# Patient Record
Sex: Male | Born: 1980 | Race: White | Hispanic: No | Marital: Married | Smoking: Former smoker
Health system: Southern US, Community
[De-identification: ages and names within clinical notes are randomized; demographics above are authoritative.]

## PROBLEM LIST (undated history)

## (undated) DIAGNOSIS — G4730 Sleep apnea, unspecified: Secondary | ICD-10-CM

## (undated) DIAGNOSIS — T7840XA Allergy, unspecified, initial encounter: Secondary | ICD-10-CM

## (undated) DIAGNOSIS — R002 Palpitations: Secondary | ICD-10-CM

## (undated) DIAGNOSIS — K769 Liver disease, unspecified: Secondary | ICD-10-CM

## (undated) HISTORY — DX: Liver disease, unspecified: K76.9

## (undated) HISTORY — DX: Allergy, unspecified, initial encounter: T78.40XA

## (undated) HISTORY — DX: Sleep apnea, unspecified: G47.30

## (undated) HISTORY — DX: Palpitations: R00.2

## (undated) HISTORY — PX: TYMPANOSTOMY TUBE PLACEMENT: SHX32

## (undated) HISTORY — PX: TONSILLECTOMY: SUR1361

---

## 2013-08-24 ENCOUNTER — Ambulatory Visit (INDEPENDENT_AMBULATORY_CARE_PROVIDER_SITE_OTHER): Payer: BC Managed Care – PPO | Admitting: Podiatry

## 2013-08-24 ENCOUNTER — Encounter: Payer: Self-pay | Admitting: Podiatry

## 2013-08-24 ENCOUNTER — Ambulatory Visit (INDEPENDENT_AMBULATORY_CARE_PROVIDER_SITE_OTHER): Payer: BC Managed Care – PPO

## 2013-08-24 VITALS — BP 115/78 | HR 66 | Resp 16 | Ht 74.0 in | Wt 210.0 lb

## 2013-08-24 DIAGNOSIS — M722 Plantar fascial fibromatosis: Secondary | ICD-10-CM

## 2013-08-24 MED ORDER — TRIAMCINOLONE ACETONIDE 10 MG/ML IJ SUSP
10.0000 mg | Freq: Once | INTRAMUSCULAR | Status: AC
  Administered 2013-08-24: 10 mg

## 2013-08-24 MED ORDER — DICLOFENAC SODIUM 75 MG PO TBEC: 75.0000 mg | DELAYED_RELEASE_TABLET | Freq: Two times a day (BID) | ORAL | Status: DC

## 2013-08-24 NOTE — Progress Notes (Signed)
Subjective:     Patient ID: Richard Vega, male   DOB: 31-Mar-1980, 33 y.o.   MRN: [id number]  Foot Pain   patient presents stating both of my heels are hurting but the left is worse and it's been going on for 6 months. Taking a lot of ibuprofen but that's not been helping   Review of Systems  All other systems reviewed and are negative.      Objective:   Physical Exam  Vitals reviewed. Constitutional: He is oriented to person, place, and time.  Cardiovascular: Intact distal pulses.   Musculoskeletal: Normal range of motion.  Neurological: He is oriented to person, place, and time.  Skin: Skin is warm.   neurovascular status intact muscle strength is found to be within normal limits and range of motion of the subtalar midtarsal joint is within normal limits. Depression of the arch is noted and I noted there to be discomfort in the plantar fascial left over right at the insertion into the calcaneus with good digital perfusion noted. Mild equinus condition was noted bilateral and diminishment of the arch height upon weightbearing     Assessment:     Plan her fasciitis of an intense nature left over right with inflammation and fluid accumulation    Plan:     H&P and x-rays reviewed and today I injected the plantar fascial left 3 mg Kenalog 5 mg like Marcaine mixture and applied fascially brace with instructions. Placed on diclofenac 75 mg twice a day and reappoint to recheck again in 1 week and discussed long-term orthotics

## 2013-08-24 NOTE — Progress Notes (Signed)
   Subjective:    Patient ID: Richard Vega, male    DOB: 10/19/1980, 33 y.o.   MRN: [id number]  HPI Comments: i have heel and arch pain in both feet. Its been going on for 6 months. The left one is worse. It hurts to walk and stand. The pain has remained the same. i wear otc inserts. i have been taking ibuprofen.   Foot Pain      Review of Systems  HENT:       Sinus problems  Eyes: Positive for redness.  Musculoskeletal:       Difficulty walking  All other systems reviewed and are negative.      Objective:   Physical Exam        Assessment & Plan:

## 2013-08-24 NOTE — Patient Instructions (Signed)

## 2013-08-31 ENCOUNTER — Ambulatory Visit: Payer: BC Managed Care – PPO | Admitting: Podiatry

## 2014-02-15 ENCOUNTER — Ambulatory Visit: Payer: Self-pay | Admitting: Physician Assistant

## 2015-09-08 ENCOUNTER — Encounter: Payer: Self-pay | Admitting: Internal Medicine

## 2015-12-07 ENCOUNTER — Ambulatory Visit (INDEPENDENT_AMBULATORY_CARE_PROVIDER_SITE_OTHER): Payer: Managed Care, Other (non HMO) | Admitting: Internal Medicine

## 2015-12-07 ENCOUNTER — Encounter (INDEPENDENT_AMBULATORY_CARE_PROVIDER_SITE_OTHER): Payer: Self-pay

## 2015-12-07 ENCOUNTER — Encounter: Payer: Self-pay | Admitting: Internal Medicine

## 2015-12-07 VITALS — BP 112/84 | HR 58 | Ht 73.0 in | Wt 211.5 lb

## 2015-12-07 DIAGNOSIS — R9431 Abnormal electrocardiogram [ECG] [EKG]: Secondary | ICD-10-CM

## 2015-12-07 DIAGNOSIS — R002 Palpitations: Secondary | ICD-10-CM

## 2015-12-07 DIAGNOSIS — G4730 Sleep apnea, unspecified: Secondary | ICD-10-CM | POA: Diagnosis not present

## 2015-12-07 HISTORY — DX: Sleep apnea, unspecified: G47.30

## 2015-12-07 HISTORY — DX: Palpitations: R00.2

## 2015-12-07 NOTE — Patient Instructions (Addendum)
Medication Instructions: - Your physician recommends that you continue on your current medications as directed. Please refer to the Current Medication list given to you today.  Labwork: - none ordered  Procedures/Testing: - none ordered  Follow-Up: - Your physician recommends that you schedule a follow-up appointment in: 10-12 weeks with Dr. Graciela HusbandsKlein.  Any Additional Special Instructions Will Be Listed Below (If Applicable). - AliveCor monitor for your phone   If you need a refill on your cardiac medications before your next appointment, please call your pharmacy.

## 2015-12-07 NOTE — Progress Notes (Signed)
ELECTROPHYSIOLOGY CONSULT NOTE  Patient ID: Richard Vega, MRN: [id number], DOB/AGE: Nov 25, 1980 35 y.o. Admit date: (Not on file) Date of Consult: 12/07/2015  Primary Physician: Lyndon CodeKHAN, FOZIA M, MD Primary Cardiologist: new Consulting Physician [phone]  Chief Complaint: abnormal Holter   HPI Richard Vega is a 35 y.o. male referred because of an abnormal Holter. This was obtained because of complaints of palpitations associated with shortness of breath. He also has significant fatigue. His wife says he snores. He also has a new baby. He has blamed his fatigue on his daughter.  He doesn't exercise because of fatigue. He has lost 20 pounds of late simply by dieting. Shortness of breath and palpitations have improved but persists.  Holter monitor 8/17 heart rate mean 77 (54/134) atrial runs-9 longest 59.5 seconds. The onset of these runs is not recorded. These were rapid rates occurred during waking hours PVC count 0 he also was noted to have significant variability in heart rates occurring during sleeping hours.  Echocardiogram 9/17 normal LV function, normal chamber sizes  Laboratory 9/17 reviewed. Hemoglobin 15.8 creatinine 1.21, potassium 4.4, TSH 2.37   Past Medical History:  Diagnosis Date  . Hyperlipidemia       Surgical History:  Past Surgical History:  Procedure Laterality Date  . TONSILLECTOMY       Home Meds: Prior to Admission medications   Medication Sig Start Date End Date Taking? Authorizing Provider  cetirizine (ZYRTEC) 10 MG tablet Take 10 mg by mouth as needed for allergies.    Historical Provider, MD  diclofenac (VOLTAREN) 75 MG EC tablet Take 1 tablet (75 mg total) by mouth 2 (two) times daily. 08/24/13   Lenn SinkNorman S Regal, DPM  sertraline (ZOLOFT) 50 MG tablet Take 50 mg by mouth daily.    Historical Provider, MD    Allergies: No Known Allergies  Social History   Social History  . Marital status: Single    Spouse name: N/A  . Number of children:  N/A  . Years of education: N/A   Occupational History  . Not on file.   Social History Main Topics  . Smoking status: Never Smoker  . Smokeless tobacco: Never Used  . Alcohol use Yes     Comment: occasionally  . Drug use: No  . Sexual activity: Not on file   Other Topics Concern  . Not on file   Social History Narrative  . No narrative on file     History reviewed. No pertinent family history.   ROS:  Please see the history of present illness.     All other systems reviewed and negative.    Physical Exam:  Blood pressure 112/84, pulse (!) 58, height 6\' 1"  (1.854 m), weight 211 lb 8 oz (95.9 kg). General: Well developed, well nourished male in no acute distress. Head: Normocephalic, atraumatic, sclera non-icteric, no xanthomas, nares are without discharge. EENT: normal  Lymph Nodes:  none Neck: Negative for carotid bruits. JVD not elevated. Back:without scoliosis kyphosis Lungs: Clear bilaterally to auscultation without wheezes, rales, or rhonchi. Breathing is unlabored. Heart: RRR with S1 S2. No  murmur . No rubs, or gallops appreciated. Abdomen: Soft, non-tender, non-distended with normoactive bowel sounds. No hepatomegaly. No rebound/guarding. No obvious abdominal masses. Msk:  Strength and tone appear normal for age. Extremities: No clubbing or cyanosis. No edema.  Distal pedal pulses are 2+ and equal bilaterally. Skin: Warm and Dry Neuro: Alert and oriented X 3. CN III-XII intact Grossly normal sensory  and motor function . Psych:  Responds to questions appropriately with a normal affect.      Labs: Cardiac Enzymes  Radiology/Studies:  No results found.  EKG: Sinus rhythm at 58 Intervals 14/09/39   Assessment and Plan:  Palpitations/dyspnea  Sleep disordered breathing  Fatigue    The patient has palpitations with dyspnea. He apparently had been while wearing his Holter monitor. While the patient events are not recorded, the interpretation portion of  the Holter notes that "no ectopic activities at diary  Time." I inferred by this that there were no arrhythmias at the time of his symptoms. Still, these have been relatively infrequently. I suggested that we use AliveCor monitor to try to clarify. There is a high likelihood that we will see nothing significant.  The rapid heart rates are sufficiently dissociated from their onset 3: P wave morphologies are difficult to distinguish as they are low amplitude but appear to have the same general appearance as sinus. This would suggest that these rapid rates are in fact sinus tachycardia.  Most importantly, however, I suspect he has significant sleep apnea. He has a deviated septum. I discussed with one of our sleep physicians as to its relevance. It can contribute anatomically. It does not preclude a sleep study. In addition, there is significant changes in RR intervals during his Holter monitor; these are occurring while he is sleeping. 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        Sherryl MangesSteven Temeca Somma

## 2016-02-22 ENCOUNTER — Ambulatory Visit: Payer: Managed Care, Other (non HMO) | Admitting: Internal Medicine

## 2016-03-17 IMAGING — CR DG CHEST 2V
1 series · 2 of 2 positions shown · non-contrast
Comparison: None.

CLINICAL DATA: Shortness of breath.

EXAM:
CHEST  2 VIEW

[Series 1: kdxr chest pa (or ap) and lat · 0.14mm/px · 2 of 2 slices shown]
[im 1/2]
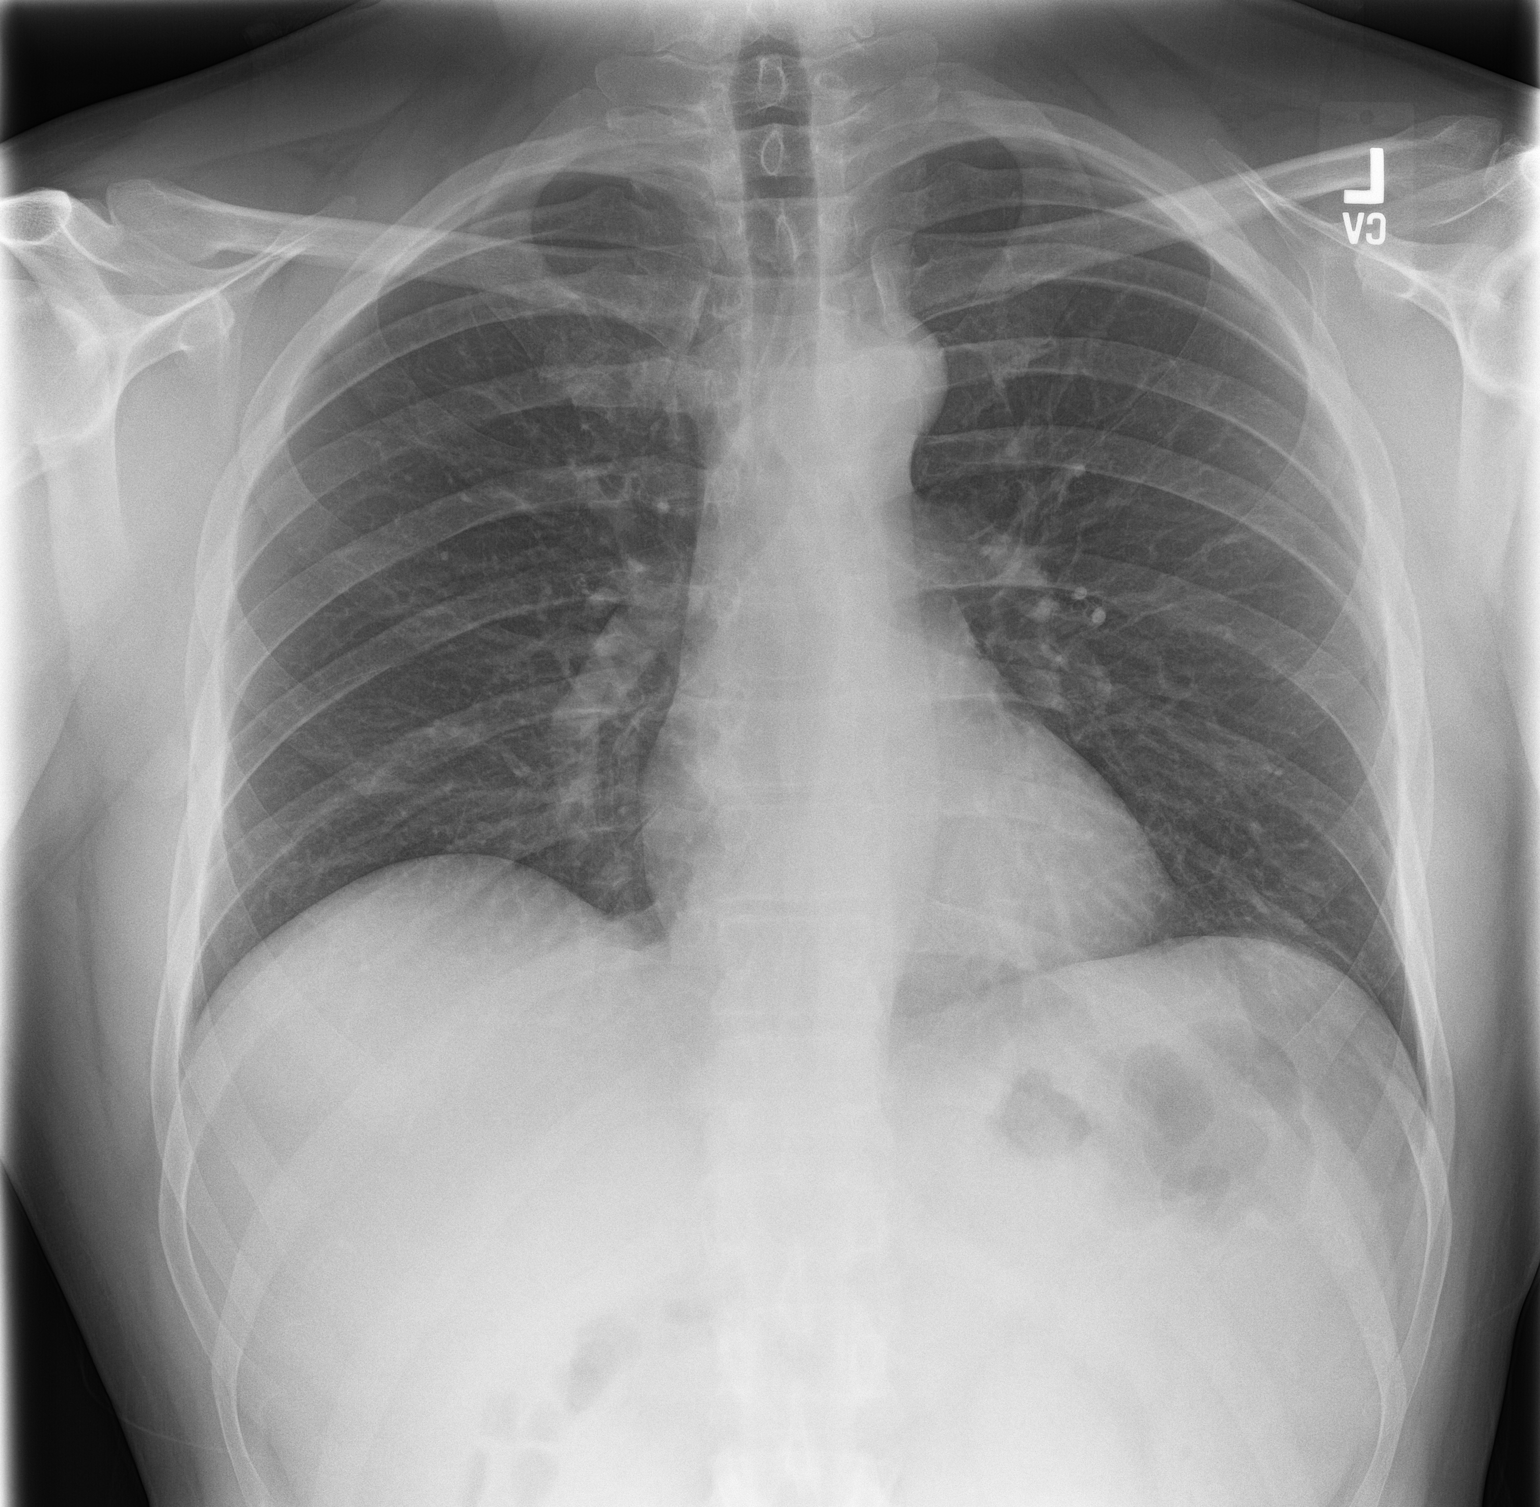
[im 2/2]
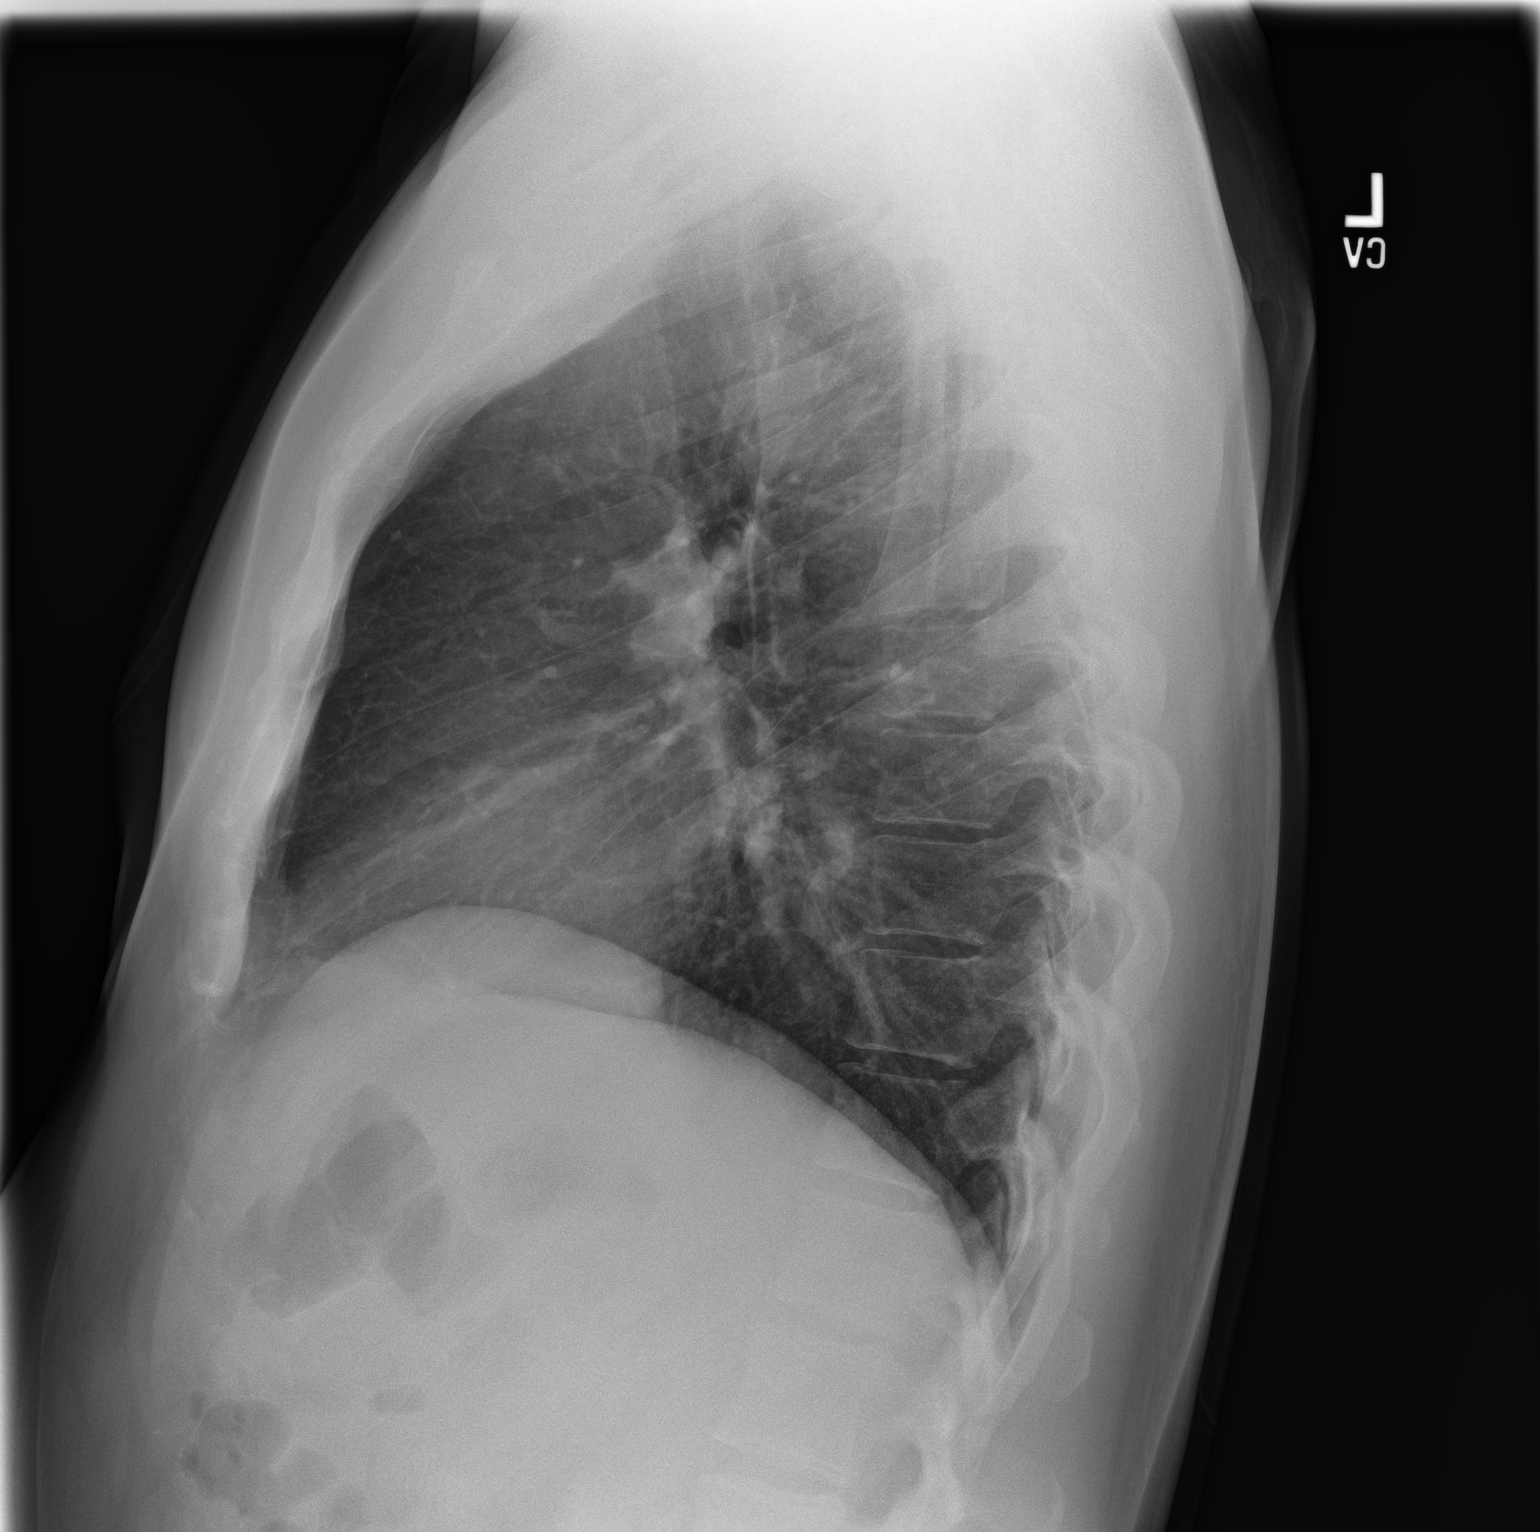

[2 of 2 positions shown; findings below may reference images not displayed]

FINDINGS: Mediastinum and hilar structures normal. Lungs are clear. No pleural
effusion or pneumothorax. Heart size normal.
IMPRESSION: No acute cardiopulmonary disease.

## 2016-09-21 DIAGNOSIS — K769 Liver disease, unspecified: Secondary | ICD-10-CM

## 2016-09-21 HISTORY — DX: Liver disease, unspecified: K76.9

## 2016-09-23 NOTE — Progress Notes (Signed)
Richard Vega Kayleanna Lorman MD, MRCP(U.K) [address]  [address]  [place], [place] [address]  Main: [phone]  Fax: [phone]   Gastroenterology Consultation  Referring Provider:     Carlean JewsBoscia, Heather E, NP Primary Care Physician:  Richard CodeKhan, Fozia M, MD Primary Gastroenterologist:  Richard Vega Guage Vega  Reason for Consultation:   Hepato splenomgealy        HPI:   Richard Vega is a 36 y.o. y/o male referred for consultation & management  by Dr. Welton FlakesKhan, Shannan HarperFozia M, MD. He has been referred for mild hepatosplenomgaly seen on USG taken in 08/2016. Normal GFR,AST/ALT/Alkphos, T bilirubin in 08/2016.No recent CBC.   He says he had had some LFT';s done after a glass of wine- was found to be elevated and subsequent tests off alcohol seem back to normal .   He says that he has been drinking 6-12 beers during the weekend for about 5 years and prior to that for about 10 years was drinking about 12-24 beers during the weekend. Denies any illegal drug use, no liver disease in the family, father had lung cancer and he was a smoker. No other symptoms. Has smoked THC in the past .      Past Medical History:  Diagnosis Date  . Hyperlipidemia   . Palpitation 12/07/2015  . Sleep-disordered breathing 12/07/2015    Past Surgical History:  Procedure Laterality Date  . TONSILLECTOMY      Prior to Admission medications   Medication Sig Start Date End Date Taking? Authorizing Provider  ALPRAZolam Prudy Feeler(XANAX) 0.25 MG tablet Take 0.25 mg by mouth at bedtime as needed for anxiety.   Yes [provider]  cetirizine (ZYRTEC) 10 MG tablet Take 10 mg by mouth as needed for allergies.   Yes [provider]  IBUPROFEN PO Take by mouth as needed.    [provider]    No family history on file.   Social History  Substance Use Topics  . Smoking status: Never Smoker  . Smokeless tobacco: Never Used  . Alcohol use Yes     Comment: occasionally    Allergies as of 09/24/2016  . (No Known  Allergies)    Review of Systems:    All systems reviewed and negative except where noted in HPI.   Physical Exam:  BP 119/81   Pulse 63   Temp 97.7 F (36.5 C) (Oral)   Ht 6\' 1"  (1.854 Vega)   Wt 213 lb 9.6 oz (96.9 kg)   BMI 28.18 kg/Vega  No LMP for male patient. Psych:  Alert and cooperative. Normal mood and affect. General:   Alert,  Well-developed, well-nourished, pleasant and cooperative in NAD Head:  Normocephalic and atraumatic. Eyes:  Sclera clear, no icterus.   Conjunctiva pink. Ears:  Normal auditory acuity. Nose:  No deformity, discharge, or lesions. Mouth:  No deformity or lesions,oropharynx pink & moist. Neck:  Supple; no masses or thyromegaly. Lungs:  Respirations even and unlabored.  Clear throughout to auscultation.   No wheezes, crackles, or rhonchi. No acute distress. Heart:  Regular rate and rhythm; no murmurs, clicks, rubs, or gallops. Abdomen:  Normal bowel sounds.  No bruits.  Soft, non-tender and non-distended without masses, hepatosplenomegaly or hernias noted.  No guarding or rebound tenderness.    Neurologic:  Alert and oriented x3;  grossly normal neurologically. Skin:  Intact without significant lesions or rashes. No jaundice. Lymph Nodes:  No significant cervical adenopathy. Psych:  Alert and cooperative. Normal mood and affect.  Imaging  Studies: No results found.  Assessment and Plan:   Richard Vega is a 36 y.o. y/o male has been referred for hepatosplenomegaly which appears mild on his USG, fatty liver also seen . 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 Counseled on the ill effects of excess alcohol use on the liver. He has agreed to stop all alcohol now    Plan  1. RUQ USG and liver elastography in 6 months after office  2. Check CBC to ensure no gross abnormality  3. Suggest weight loss, healthy diet   Follow up in 6 months   Dr Richard Mood MD,MRCP(U.K)   .

## 2016-09-24 ENCOUNTER — Encounter: Payer: Self-pay | Admitting: Gastroenterology

## 2016-09-24 ENCOUNTER — Ambulatory Visit (INDEPENDENT_AMBULATORY_CARE_PROVIDER_SITE_OTHER): Payer: Managed Care, Other (non HMO) | Admitting: Gastroenterology

## 2016-09-24 ENCOUNTER — Encounter (INDEPENDENT_AMBULATORY_CARE_PROVIDER_SITE_OTHER): Payer: Self-pay

## 2016-09-24 VITALS — BP 119/81 | HR 63 | Temp 97.7°F | Ht 73.0 in | Wt 213.6 lb

## 2016-09-24 DIAGNOSIS — R162 Hepatomegaly with splenomegaly, not elsewhere classified: Secondary | ICD-10-CM

## 2016-09-24 DIAGNOSIS — K700 Alcoholic fatty liver: Secondary | ICD-10-CM

## 2016-09-24 LAB — CBC WITH DIFFERENTIAL/PLATELET
BASOS ABS: 0.1 10*3/uL (ref 0.0–0.2)
Basos: 1 %
EOS (ABSOLUTE): 0.1 10*3/uL (ref 0.0–0.4)
Eos: 2 %
Hematocrit: 44.4 % (ref 37.5–51.0)
Hemoglobin: 15.4 g/dL (ref 13.0–17.7)
IMMATURE GRANS (ABS): 0 10*3/uL (ref 0.0–0.1)
IMMATURE GRANULOCYTES: 0 %
LYMPHS: 31 %
Lymphocytes Absolute: 1.9 10*3/uL (ref 0.7–3.1)
MCH: 30.3 pg (ref 26.6–33.0)
MCHC: 34.7 g/dL (ref 31.5–35.7)
MCV: 87 fL (ref 79–97)
MONOS ABS: 0.3 10*3/uL (ref 0.1–0.9)
Monocytes: 5 %
NEUTROS PCT: 61 %
Neutrophils Absolute: 3.7 10*3/uL (ref 1.4–7.0)
PLATELETS: 216 10*3/uL (ref 150–379)
RBC: 5.08 x10E6/uL (ref 4.14–5.80)
RDW: 12.8 % (ref 12.3–15.4)
WBC: 6.1 10*3/uL (ref 3.4–10.8)

## 2016-09-30 ENCOUNTER — Telehealth: Payer: Self-pay

## 2016-09-30 NOTE — Telephone Encounter (Signed)
Advised patient of results per Dr. Tobi BastosAnna .  CBCD-normal

## 2016-09-30 NOTE — Telephone Encounter (Signed)
-----   Message from Wyline MoodKiran Anna, MD sent at 09/29/2016  9:22 PM EDT ----- CBCD-normal

## 2016-10-29 ENCOUNTER — Encounter: Payer: Self-pay | Admitting: *Deleted

## 2016-10-30 ENCOUNTER — Ambulatory Visit (INDEPENDENT_AMBULATORY_CARE_PROVIDER_SITE_OTHER): Payer: 59 | Admitting: General Surgery

## 2016-10-30 ENCOUNTER — Encounter: Payer: Self-pay | Admitting: General Surgery

## 2016-10-30 VITALS — BP 124/82 | HR 92 | Resp 14 | Ht 73.0 in | Wt 214.0 lb

## 2016-10-30 DIAGNOSIS — K4090 Unilateral inguinal hernia, without obstruction or gangrene, not specified as recurrent: Secondary | ICD-10-CM

## 2016-10-30 NOTE — Patient Instructions (Signed)
Inguinal Hernia, Adult An inguinal hernia is when fat or the intestines push through the area where the leg meets the lower belly (groin) and make a rounded lump (bulge). This condition happens over time. There are three types of inguinal hernias. These types include:  Hernias that can be pushed back into the belly (are reducible).  Hernias that cannot be pushed back into the belly (are incarcerated).  Hernias that cannot be pushed back into the belly and lose their blood supply (get strangulated). This type needs emergency surgery.  Follow these instructions at home: Lifestyle  Drink enough fluid to keep your urine (pee) clear or pale yellow.  Eat plenty of fruits, vegetables, and whole grains. These have a lot of fiber. Talk with your doctor if you have questions.  Avoid lifting heavy objects.  Avoid standing for long periods of time.  Do not use tobacco products. These include cigarettes, chewing tobacco, or e-cigarettes. If you need help quitting, ask your doctor.  Try to stay at a healthy weight. General instructions  Do not try to force the hernia back in.  Watch your hernia for any changes in color or size. Let your doctor know if there are any changes.  Take over-the-counter and prescription medicines only as told by your doctor.  Keep all follow-up visits as told by your doctor. This is important. Contact a doctor if:  You have a fever.  You have new symptoms.  Your symptoms get worse. Get help right away if:  The area where the legs meets the lower belly has: ? Pain that gets worse suddenly. ? A bulge that gets bigger suddenly and does not go down. ? A bulge that turns red or purple. ? A bulge that is painful to the touch.  You are a man and your scrotum: ? Suddenly feels painful. ? Suddenly changes in size.  You feel sick to your stomach (nauseous) and this feeling does not go away.  You throw up (vomit) and this keeps happening.  You feel your heart  beating a lot more quickly than normal.  You cannot poop (have a bowel movement) or pass gas. This information is not intended to replace advice given to you by your health care provider. Make sure you discuss any questions you have with your health care provider. Document Released: 02/07/2006 Document Revised: 06/15/2015 Document Reviewed: 11/17/2013 Elsevier Interactive Patient Education  2018 Elsevier Inc.  

## 2016-10-30 NOTE — Progress Notes (Signed)
Patient ID: Richard Vega, male   DOB: December 18, 1980, 36 y.o.   MRN: [id number]  Chief Complaint  Patient presents with  . Other    HPI Richard Vega is a 36 y.o. male here today for a evaluation of a left inguinal hernia referred by Vincent Gros NP. Patient noticed this knot area for about a year. He states it has gotten worse. He states it is worse with sneezing and coughing.  Abdominal ultrasound 09-02-16.  HPI  Past Medical History:  Diagnosis Date  . Allergy   . Liver disease 09/2016   enlarged  . Palpitation 12/07/2015  . Sleep-disordered breathing 12/07/2015    Past Surgical History:  Procedure Laterality Date  . TONSILLECTOMY    . TYMPANOSTOMY TUBE PLACEMENT     x2    Family History  Problem Relation Age of Onset  . Lung cancer Father 49    Social History Social History  Substance Use Topics  . Smoking status: Former Smoker    Years: 3.00    Types: Cigarettes    Quit date: 01/21/2009  . Smokeless tobacco: Never Used  . Alcohol use No     Comment: quit September 2018    No Known Allergies  Current Outpatient Prescriptions  Medication Sig Dispense Refill  . ALPRAZolam (XANAX) 0.25 MG tablet Take 0.25 mg by mouth at bedtime as needed for anxiety.    . cetirizine (ZYRTEC) 10 MG tablet Take 10 mg by mouth as needed for allergies.    . IBUPROFEN PO Take by mouth as needed.     No current facility-administered medications for this visit.     Review of Systems Review of Systems  Constitutional: Negative.   Respiratory: Negative.   Cardiovascular: Negative.   Gastrointestinal: Negative for constipation and diarrhea.    Blood pressure 124/82, pulse 92, resp. rate 14, height  (1.854 m), weight 214 lb (97.1 kg).  Physical Exam Physical Exam  Constitutional: He is oriented to person, place, and time. He appears well-developed and well-nourished.  HENT:  Mouth/Throat: Oropharynx is clear and moist.  Eyes: Conjunctivae are normal. No scleral  icterus.  Neck: Neck supple.  Cardiovascular: Normal rate, regular rhythm and normal heart sounds.   Pulmonary/Chest: Effort normal and breath sounds normal.  Abdominal: Soft. Normal appearance. There is no hepatomegaly. There is no tenderness. A hernia is present. Hernia confirmed positive in the left inguinal area. Hernia confirmed negative in the right inguinal area.  Lymphadenopathy:    He has no cervical adenopathy.  Neurological: He is alert and oriented to person, place, and time.  Skin: Skin is warm and dry.  Psychiatric: His behavior is normal.    Data Reviewed  Progress notes   Assessment    Small left inguinal hernia, symptomatic    Plan     Hernia precautions and incarceration were discussed with the patient. If they develop symptoms of an incarcerated hernia, they were encouraged to seek prompt medical attention.  I have recommended repair of the hernia using mesh on an outpatient basis in the near future. The risk of infection was reviewed. The role of prosthetic mesh to minimize the risk of recurrence was reviewed.  HPI, Physical Exam, Assessment and Plan have been scribed under the direction and in the presence of Kathreen Cosier, MD Dorathy Daft, RN  I have completed the exam and reviewed the above documentation for accuracy and completeness.  I agree with the above.  Museum/gallery conservator has been used and any  errors in dictation or transcription are unintentional.  Nahiem Dredge G. Evette Cristal, M.D., F.A.C.S.   Gerlene Burdock G 10/30/2016, 3:15 PM   Patient would like a cost estimate mailed to him before scheduling a date for surgery. Patient was given day of surgery instructions. He will contact the office if he wishes to proceed.   Nicholes Mango, CMA

## 2016-10-31 ENCOUNTER — Encounter: Payer: Self-pay | Admitting: General Surgery

## 2017-01-07 ENCOUNTER — Other Ambulatory Visit: Payer: Self-pay

## 2017-01-10 ENCOUNTER — Other Ambulatory Visit: Payer: Self-pay | Admitting: Nurse Practitioner

## 2017-01-10 MED ORDER — ALPRAZOLAM 0.25 MG PO TABS: 0.2500 mg | ORAL_TABLET | Freq: Two times a day (BID) | ORAL | 2 refills | Status: DC | PRN

## 2017-01-10 NOTE — Progress Notes (Signed)
Approved request for alprazolam 0.25mg  BID prn #60 with 2 refills.

## 2017-01-16 ENCOUNTER — Other Ambulatory Visit: Payer: Self-pay | Admitting: Family Medicine

## 2017-02-07 LAB — HEPATIC FUNCTION PANEL
ALT: 28 IU/L (ref 0–44)
AST: 20 IU/L (ref 0–40)
Albumin: 4.6 g/dL (ref 3.5–5.5)
Alkaline Phosphatase: 72 IU/L (ref 39–117)
Bilirubin Total: 0.5 mg/dL (ref 0.0–1.2)
Bilirubin, Direct: 0.13 mg/dL (ref 0.00–0.40)
TOTAL PROTEIN: 7.1 g/dL (ref 6.0–8.5)

## 2017-03-10 ENCOUNTER — Ambulatory Visit: Payer: Self-pay | Admitting: Nurse Practitioner

## 2017-03-27 ENCOUNTER — Other Ambulatory Visit: Payer: Self-pay | Admitting: Nurse Practitioner

## 2017-03-27 DIAGNOSIS — F411 Generalized anxiety disorder: Secondary | ICD-10-CM

## 2017-03-27 MED ORDER — ALPRAZOLAM 0.25 MG PO TABS: 0.2500 mg | ORAL_TABLET | Freq: Two times a day (BID) | ORAL | 2 refills | Status: DC | PRN

## 2017-03-27 NOTE — Progress Notes (Signed)
Renewed alprazolam 0.25mg  bid as needed. Patient has appointment 04/08/2017

## 2017-03-31 ENCOUNTER — Encounter: Payer: Self-pay | Admitting: Gastroenterology

## 2017-03-31 ENCOUNTER — Encounter (INDEPENDENT_AMBULATORY_CARE_PROVIDER_SITE_OTHER): Payer: Self-pay

## 2017-03-31 ENCOUNTER — Ambulatory Visit (INDEPENDENT_AMBULATORY_CARE_PROVIDER_SITE_OTHER): Payer: 59 | Admitting: Gastroenterology

## 2017-03-31 VITALS — BP 121/79 | HR 75 | Ht 73.0 in | Wt 216.0 lb

## 2017-03-31 DIAGNOSIS — K760 Fatty (change of) liver, not elsewhere classified: Secondary | ICD-10-CM | POA: Diagnosis not present

## 2017-03-31 DIAGNOSIS — R162 Hepatomegaly with splenomegaly, not elsewhere classified: Secondary | ICD-10-CM

## 2017-03-31 NOTE — Progress Notes (Signed)
Wyline MoodKiran Shital Crayton MD, MRCP(U.K) [address]  [address]  [place], [place] [address]  Main: [phone]  Fax: [phone]   Primary Care Physician: Lyndon CodeKhan, Fozia M, MD  Primary Gastroenterologist:  Dr. Wyline MoodKiran Siraj Dermody   Chief Complaint  Patient presents with  . Follow-up    HPI: Richard Vega is a 37 y.o. male    Summary of history : He was initially referred and seen in 09/2016 for mild hepatosplenomgaly seen on USG taken in 08/2016. Normal GFR,AST/ALT/Alkphos, T bilirubin in 08/2016.No recent CBC. He says he had had some LFT';s done after a glass of wine- was found to be elevated and subsequent tests off alcohol seem back to normal . He says that he has been drinking 6-12 beers during the weekend for about 5 years and prior to that for about 10 years was drinking about 12-24 beers during the weekend. Denies any illegal drug use, no liver disease in the family, father had lung cancer and he was a smoker. No other symptoms. Has smoked THC in the past    Interval history   09/24/2016-  03/31/2016   Since last visit gained 3 lbs .No beer/alcohol since last visit. No exercise/    BP 121/79 (BP Location: Left Arm, Patient Position: Sitting, Cuff Size: Large)   Pulse 75   Ht 6\' 1"  (1.854 m)   Wt 216 lb (98 kg)   BMI 28.50 kg/m    Current Outpatient Medications  Medication Sig Dispense Refill  . albuterol (PROVENTIL) (2.5 MG/3ML) 0.083% nebulizer solution USE ONE VIAL VIA NEBULIZER EVERY 8 HOURS AS NEEDED  0  . ALPRAZolam (XANAX) 0.25 MG tablet Take 1 tablet (0.25 mg total) by mouth 2 (two) times daily as needed for anxiety. 60 tablet 2  . cetirizine (ZYRTEC) 10 MG tablet Take 10 mg by mouth as needed for allergies.    . IBUPROFEN PO Take by mouth as needed.     No current facility-administered medications for this visit.     Allergies as of 03/31/2017  . (No Known Allergies)    ROS:  General: Negative for anorexia, weight loss, fever, chills, fatigue, weakness. ENT: Negative  for hoarseness, difficulty swallowing , nasal congestion. CV: Negative for chest pain, angina, palpitations, dyspnea on exertion, peripheral edema.  Respiratory: Negative for dyspnea at rest, dyspnea on exertion, cough, sputum, wheezing.  GI: See history of present illness. GU:  Negative for dysuria, hematuria, urinary incontinence, urinary frequency, nocturnal urination.  Endo: Negative for unusual weight change.    Physical Examination:   BP 121/79 (BP Location: Left Arm, Patient Position: Sitting, Cuff Size: Large)   Pulse 75   Ht 6\' 1"  (1.854 m)   Wt 216 lb (98 kg)   BMI 28.50 kg/m   General: Well-nourished, well-developed in no acute distress.  Eyes: No icterus. Conjunctivae pink. Mouth: Oropharyngeal mucosa moist and pink , no lesions erythema or exudate. Lungs: Clear to auscultation bilaterally. Non-labored. Heart: Regular rate and rhythm, no murmurs rubs or gallops.  Abdomen: Bowel sounds are normal, nontender, nondistended, no hepatosplenomegaly or masses, no abdominal bruits or hernia , no rebound or guarding.   Extremities: No lower extremity edema. No clubbing or deformities. Neuro: Alert and oriented x 3.  Grossly intact. Skin: Warm and dry, no jaundice.   Psych: Alert and cooperative, normal mood and affect.   Imaging Studies: No results found.  Assessment and Plan:   Richard OysterJonathan T Vega is a 37 y.o. y/o male here to follow up  for hepatosplenomegaly which appears mild on his USG, fatty liver also seen . Hepatic function panel is normal , Very likely he has fatty liver disease .He has quit all alcohol, consumes in excess sodas , explained high sugar content likely worsen fatty liver. His BMI is overweight, counseled on healthy eating , weight loss  Plan  1. RUQ USG to evaluate liver and spleen  2. Patient information on fatty liver.    Dr [name]  MD,MRCP [facility]) Follow up in 1 year for fatty liver

## 2017-04-04 ENCOUNTER — Ambulatory Visit: Payer: 59

## 2017-04-08 ENCOUNTER — Ambulatory Visit: Payer: Self-pay | Admitting: Nurse Practitioner

## 2017-10-23 ENCOUNTER — Encounter: Payer: Self-pay | Admitting: Gastroenterology

## 2017-10-23 ENCOUNTER — Ambulatory Visit: Payer: 59 | Admitting: Gastroenterology

## 2017-10-23 ENCOUNTER — Telehealth: Payer: Self-pay

## 2017-10-23 VITALS — BP 115/79 | HR 80 | Ht 73.0 in | Wt 229.4 lb

## 2017-10-23 DIAGNOSIS — K760 Fatty (change of) liver, not elsewhere classified: Secondary | ICD-10-CM | POA: Diagnosis not present

## 2017-10-23 NOTE — Telephone Encounter (Signed)
Spoke with pt and informed him of Ultrasound appointment information. Pt is aware of time, location, and that he must be NPO 6 hours prior to the exam.

## 2017-10-23 NOTE — Progress Notes (Signed)
[name] MD, MRCP(U.K) [address]  [address]  [place], [place] [address]  Main: [phone]  Fax: [phone]   Primary Care Physician: Lyndon Code, MD  Primary Gastroenterologist:  Dr. [name]   Chief Complaint  Patient presents with  . Follow-up    Fatty Liver    HPI: LION FERNANDEZ is a 37 y.o. male  Summary of history : He was initially referred and seen in 09/2016 for mild hepatosplenomgaly seen on USG taken in 08/2016. Normal GFR,AST/ALT/Alkphos, T bilirubin in 08/2016.Marland KitchenHe says he had had some LFT';s done after a glass of wine- was found to be elevated and subsequent tests off alcohol seem back to normal . He says that he has been drinking 6-12 beers during the weekend for about 5 years and prior to that for about 10 years was drinking about 12-24 beers during the weekend. Denies any illegal drug use, no liver disease in the family, father had lung cancer and he was a smoker. No other symptoms. Has smoked THC in the past    Interval history  03/31/2017 -10/23/17   Gained 12 lbs since last visit. .No beer/alcohol since last visit.  No exercise either. Eating less qty since last visit- cur down on portions.     BP 115/79   Pulse 80   Ht 6\' 1"  (1.854 m)   Wt 229 lb 6.4 oz (104.1 kg)   BMI 30.27 kg/m    Current Outpatient Medications  Medication Sig Dispense Refill  . albuterol (PROVENTIL) (2.5 MG/3ML) 0.083% nebulizer solution USE ONE VIAL VIA NEBULIZER EVERY 8 HOURS AS NEEDED  0  . ALPRAZolam (XANAX) 0.25 MG tablet Take 1 tablet (0.25 mg total) by mouth 2 (two) times daily as needed for anxiety. 60 tablet 2  . cetirizine (ZYRTEC) 10 MG tablet Take 10 mg by mouth as needed for allergies.    . IBUPROFEN PO Take by mouth as needed.     No current facility-administered medications for this visit.     Allergies as of 10/23/2017  . (No Known Allergies)    ROS:  General: Negative for anorexia, weight loss, fever, chills, fatigue, weakness. ENT:  Negative for hoarseness, difficulty swallowing , nasal congestion. CV: Negative for chest pain, angina, palpitations, dyspnea on exertion, peripheral edema.  Respiratory: Negative for dyspnea at rest, dyspnea on exertion, cough, sputum, wheezing.  GI: See history of present illness. GU:  Negative for dysuria, hematuria, urinary incontinence, urinary frequency, nocturnal urination.  Endo: Negative for unusual weight change.    Physical Examination:   BP 115/79   Pulse 80   Ht 6\' 1"  (1.854 m)   Wt 229 lb 6.4 oz (104.1 kg)   BMI 30.27 kg/m   General: Well-nourished, well-developed in no acute distress.  Eyes: No icterus. Conjunctivae pink. Mouth: Oropharyngeal mucosa moist and pink , no lesions erythema or exudate. Lungs: Clear to auscultation bilaterally. Non-labored. Heart: Regular rate and rhythm, no murmurs rubs or gallops.  Abdomen: Bowel sounds are normal, nontender, nondistended, no hepatosplenomegaly or masses, no abdominal bruits or hernia , no rebound or guarding.   Extremities: No lower extremity edema. No clubbing or deformities. Neuro: Alert and oriented x 3.  Grossly intact. Skin: Warm and dry, no jaundice.   Psych: Alert and cooperative, normal mood and affect.   Imaging Studies: No results found.  Assessment and Plan:   TIMOFEY CARANDANG is a 37 y.o. y/o malehere to follow up  for hepatosplenomegaly which appears mild on  his USG, fatty liver also seen . l , Very likely he has fatty liver disease .He has quit all alcohol, consumes in excess sodas , explained high sugar content likely worsen fatty liver.Counseled on healthy eating , weight loss  Plan  1.RUQ USG to evaluate liver and spleen since last year 2. Check Hep A/B/C serology and vaccinate as needed 3. Lose weight   4. Stop drinking sodas - presently consumes 3 bottles a day- informed has large qty of sugar and adds to weight gain   Dr [name]  MD,MRCP [facility]) Follow up in 1 year

## 2017-10-24 LAB — HEPATITIS B SURFACE ANTIBODY,QUALITATIVE: Hep B Surface Ab, Qual: NONREACTIVE

## 2017-10-24 LAB — COMPREHENSIVE METABOLIC PANEL
ALBUMIN: 4.6 g/dL (ref 3.5–5.5)
ALK PHOS: 76 IU/L (ref 39–117)
ALT: 54 IU/L — ABNORMAL HIGH (ref 0–44)
AST: 26 IU/L (ref 0–40)
Albumin/Globulin Ratio: 2.3 — ABNORMAL HIGH (ref 1.2–2.2)
BILIRUBIN TOTAL: 0.5 mg/dL (ref 0.0–1.2)
BUN / CREAT RATIO: 12 (ref 9–20)
BUN: 13 mg/dL (ref 6–20)
CHLORIDE: 103 mmol/L (ref 96–106)
CO2: 21 mmol/L (ref 20–29)
Calcium: 9.4 mg/dL (ref 8.7–10.2)
Creatinine, Ser: 1.1 mg/dL (ref 0.76–1.27)
GFR calc non Af Amer: 85 mL/min/{1.73_m2} (ref >59)
GFR, EST AFRICAN AMERICAN: 98 mL/min/{1.73_m2} (ref >59)
GLOBULIN, TOTAL: 2 g/dL (ref 1.5–4.5)
Glucose: 157 mg/dL — ABNORMAL HIGH (ref 65–99)
Potassium: 3.9 mmol/L (ref 3.5–5.2)
SODIUM: 140 mmol/L (ref 134–144)
TOTAL PROTEIN: 6.6 g/dL (ref 6.0–8.5)

## 2017-10-24 LAB — HEPATITIS C ANTIBODY: Hep C Virus Ab: <0.1 s/co ratio (ref 0.0–0.9)

## 2017-10-24 LAB — HEPATITIS A ANTIBODY, TOTAL: HEP A TOTAL AB: NEGATIVE

## 2017-10-28 ENCOUNTER — Telehealth: Payer: Self-pay | Admitting: Gastroenterology

## 2017-10-28 NOTE — Telephone Encounter (Signed)
-----   Message from [name], MD sent at 10/26/2017  7:18 PM EDT ----- Richard Vega inform needs Hep A/B vaccine - hep c is negative.   C/c Lyndon Code, MD   Dr [name] MD,MRCP [facility]) Gastroenterology/Hepatology Pager: [phone]

## 2017-10-28 NOTE — Telephone Encounter (Signed)
Spoke with pt regarding request for lab result clarification. Pt is also aware of Dr. Johnney Killian instructions to receive Hep A/B vaccine. Pt will call to schedule a nurse visit for vaccine.

## 2017-10-28 NOTE — Telephone Encounter (Signed)
Patient has some questions on his lab results, please call him.

## 2017-10-29 ENCOUNTER — Ambulatory Visit
Admission: RE | Admit: 2017-10-29 | Discharge: 2017-10-29 | Disposition: A | Discharge status: Home / Self Care | Payer: BLUE CROSS/BLUE SHIELD | Source: Ambulatory Visit | Attending: Gastroenterology | Admitting: Gastroenterology

## 2017-10-29 DIAGNOSIS — K760 Fatty (change of) liver, not elsewhere classified: Secondary | ICD-10-CM | POA: Diagnosis not present

## 2017-10-29 DIAGNOSIS — K7581 Nonalcoholic steatohepatitis (NASH): Secondary | ICD-10-CM | POA: Diagnosis not present

## 2017-10-31 ENCOUNTER — Telehealth: Payer: Self-pay | Admitting: Gastroenterology

## 2017-10-31 NOTE — Telephone Encounter (Signed)
Patient called & would like his results from an u/s done 10-29-17. Please call.

## 2017-11-02 ENCOUNTER — Encounter: Payer: Self-pay | Admitting: Gastroenterology

## 2017-11-04 NOTE — Telephone Encounter (Signed)
Spoke with pt and informed him of ultrasound results

## 2017-11-27 ENCOUNTER — Ambulatory Visit: Payer: 59 | Admitting: General Surgery

## 2017-11-28 ENCOUNTER — Encounter: Payer: Self-pay | Admitting: Nurse Practitioner

## 2017-11-28 ENCOUNTER — Ambulatory Visit: Payer: BLUE CROSS/BLUE SHIELD | Admitting: Adult Health

## 2017-11-28 VITALS — BP 123/78 | HR 80 | Resp 16 | Ht 73.0 in | Wt 224.4 lb

## 2017-11-28 DIAGNOSIS — F411 Generalized anxiety disorder: Secondary | ICD-10-CM

## 2017-11-28 DIAGNOSIS — E1165 Type 2 diabetes mellitus with hyperglycemia: Secondary | ICD-10-CM | POA: Diagnosis not present

## 2017-11-28 LAB — POCT GLYCOSYLATED HEMOGLOBIN (HGB A1C): HEMOGLOBIN A1C: 5.7 % — AB (ref 4.0–5.6)

## 2017-11-28 MED ORDER — ALPRAZOLAM 0.25 MG PO TABS: 0.2500 mg | ORAL_TABLET | Freq: Two times a day (BID) | ORAL | 1 refills | Status: DC | PRN

## 2017-11-28 NOTE — Patient Instructions (Signed)

## 2017-11-28 NOTE — Progress Notes (Signed)
[facility] [address] [place], [place] [address]  Internal MEDICINE  Office Visit Note  Patient Name: Richard Vega  [id number]  [id number]  Date of Service: 11/28/2017  Chief Complaint  Patient presents with  . Medical Management of Chronic Issues    6 month follow up, A1C    HPI Patient is here for six-month follow-up on his A1c and anxiety.  Patient reports his anxiety is much improved now that he has changed jobs.  He reports he uses 1 tab of Xanax on the occasion when he has trouble falling asleep.  We have been monitoring his A1c because he had multiple high readings in the past.  He has never been on any medications.  About a month ago he stopped drinking soda and his A1c today is now 5.7.  Patient is very happy with this and reports he will not start back drinking soda.  He denies any other issues or complaints at this visit.   Current Medication: Outpatient Encounter Medications as of 11/28/2017  Medication Sig  . albuterol (PROVENTIL) (2.5 MG/3ML) 0.083% nebulizer solution USE ONE VIAL VIA NEBULIZER EVERY 8 HOURS AS NEEDED  . ALPRAZolam (XANAX) 0.25 MG tablet Take 1 tablet (0.25 mg total) by mouth 2 (two) times daily as needed for anxiety.  . cetirizine (ZYRTEC) 10 MG tablet Take 10 mg by mouth as needed for allergies.  . IBUPROFEN PO Take by mouth as needed.  . [DISCONTINUED] ALPRAZolam (XANAX) 0.25 MG tablet Take 1 tablet (0.25 mg total) by mouth 2 (two) times daily as needed for anxiety.   No facility-administered encounter medications on file as of 11/28/2017.     Surgical History: Past Surgical History:  Procedure Laterality Date  . TONSILLECTOMY    . TYMPANOSTOMY TUBE PLACEMENT     x2    Medical History: Past Medical History:  Diagnosis Date  . Allergy   . Liver disease 09/2016   enlarged  . Palpitation 12/07/2015  . Sleep-disordered breathing 12/07/2015    Family History: Family History  Problem Relation Age of Onset  . Lung cancer  Father 60    Social History   Socioeconomic History  . Marital status: Married    Spouse name: Not on file  . Number of children: Not on file  . Years of education: Not on file  . Highest education level: Not on file  Occupational History  . Not on file  Social Needs  . Financial resource strain: Not on file  . Food insecurity:    Worry: Not on file    Inability: Not on file  . Transportation needs:    Medical: Not on file    Non-medical: Not on file  Tobacco Use  . Smoking status: Former Smoker    Years: 3.00    Types: Cigarettes    Last attempt to quit: 01/21/2009    Years since quitting: 8.8  . Smokeless tobacco: Never Used  Substance and Sexual Activity  . Alcohol use: No    Comment: quit September 2018  . Drug use: No  . Sexual activity: Yes  Lifestyle  . Physical activity:    Days per week: Not on file    Minutes per session: Not on file  . Stress: Not on file  Relationships  . Social connections:    Talks on phone: Not on file    Gets together: Not on file    Attends religious service: Not on file    Active member of club  or organization: Not on file    Attends meetings of clubs or organizations: Not on file    Relationship status: Not on file  . Intimate partner violence:    Fear of current or ex partner: Not on file    Emotionally abused: Not on file    Physically abused: Not on file    Forced sexual activity: Not on file  Other Topics Concern  . Not on file  Social History Narrative  . Not on file      Review of Systems  Constitutional: Negative.  Negative for chills, fatigue and unexpected weight change.  HENT: Negative.  Negative for congestion, rhinorrhea, sneezing and sore throat.   Eyes: Negative for redness.  Respiratory: Negative.  Negative for cough, chest tightness and shortness of breath.   Cardiovascular: Negative.  Negative for chest pain and palpitations.  Gastrointestinal: Negative.  Negative for abdominal pain, constipation,  diarrhea, nausea and vomiting.  Endocrine: Negative.   Genitourinary: Negative.  Negative for dysuria and frequency.  Musculoskeletal: Negative.  Negative for arthralgias, back pain, joint swelling and neck pain.  Skin: Negative.  Negative for rash.  Allergic/Immunologic: Negative.   Neurological: Negative.  Negative for tremors and numbness.  Hematological: Negative for adenopathy. Does not bruise/bleed easily.  Psychiatric/Behavioral: Negative.  Negative for behavioral problems, sleep disturbance and suicidal ideas. The patient is not nervous/anxious.     Vital Signs: BP 123/78 (BP Location: Left Arm, Patient Position: Sitting, Cuff Size: Large)   Pulse 80   Resp 16   Ht 6\' 1"  (1.854 m)   Wt 224 lb 6.4 oz (101.8 kg)   SpO2 98%   BMI 29.61 kg/m    Physical Exam  Constitutional: He is oriented to person, place, and time. He appears well-developed and well-nourished. No distress.  HENT:  Head: Normocephalic and atraumatic.  Mouth/Throat: Oropharynx is clear and moist. No oropharyngeal exudate.  Eyes: Pupils are equal, round, and reactive to light. EOM are normal.  Neck: Normal range of motion. Neck supple. No JVD present. No tracheal deviation present. No thyromegaly present.  Cardiovascular: Normal rate, regular rhythm and normal heart sounds. Exam reveals no gallop and no friction rub.  No murmur heard. Pulmonary/Chest: Effort normal and breath sounds normal. No respiratory distress. He has no wheezes. He has no rales. He exhibits no tenderness.  Abdominal: Soft. There is no tenderness. There is no guarding.  Musculoskeletal: Normal range of motion.  Lymphadenopathy:    He has no cervical adenopathy.  Neurological: He is alert and oriented to person, place, and time. No cranial nerve deficit.  Skin: Skin is warm and dry. He is not diaphoretic.  Psychiatric: He has a normal mood and affect. His behavior is normal. Judgment and thought content normal.  Nursing note and vitals  reviewed.  Assessment/Plan: 1. Uncontrolled type 2 diabetes mellitus with hyperglycemia (HCC) Pt's HgB A1C today is 5.7.  - POCT HgB A1C  2. Generalized anxiety disorder Refill patient's Xanax at this visit. - ALPRAZolam (XANAX) 0.25 MG tablet; Take 1 tablet (0.25 mg total) by mouth 2 (two) times daily as needed for anxiety.  Dispense: 60 tablet; Refill: 1 Reviewed risks and possible side effects associated with taking opiates, benzodiazepines and other CNS depressants. Combination of these could cause dizziness and drowsiness. Advised patient not to drive or operate machinery when taking these medications, as patient's and other's life can be at risk and will have consequences. Patient verbalized understanding in this matter. Dependence and abuse for these drugs  will be monitored closely. A Controlled substance policy and procedure is on file which allows [facility] to order a urine drug screen test at any visit. Patient understands and agrees with the plan  General Counseling: chadrick sprinkle understanding of the findings of todays visit and agrees with plan of treatment. I have discussed any further diagnostic evaluation that may be needed or ordered today. We also reviewed his medications today. he has been encouraged to call the office with any questions or concerns that should arise related to todays visit.    Orders Placed This Encounter  Procedures  . POCT HgB A1C    Meds ordered this encounter  Medications  . ALPRAZolam (XANAX) 0.25 MG tablet    Sig: Take 1 tablet (0.25 mg total) by mouth 2 (two) times daily as needed for anxiety.    Dispense:  60 tablet    Refill:  1    Time spent: 25 Minutes   This patient was seen by Blima Ledger AGNP-C in Collaboration with Dr Lyndon Code as a part of collaborative care agreement     Johnna Acosta AGNP-C Internal medicine

## 2017-12-08 DIAGNOSIS — L218 Other seborrheic dermatitis: Secondary | ICD-10-CM | POA: Diagnosis not present

## 2017-12-08 DIAGNOSIS — L570 Actinic keratosis: Secondary | ICD-10-CM | POA: Diagnosis not present

## 2018-01-27 ENCOUNTER — Encounter: Payer: Self-pay | Admitting: *Deleted

## 2018-02-27 ENCOUNTER — Ambulatory Visit: Payer: Self-pay | Admitting: Adult Health

## 2018-03-20 ENCOUNTER — Ambulatory Visit: Payer: Managed Care, Other (non HMO) | Admitting: Adult Health

## 2018-03-20 ENCOUNTER — Other Ambulatory Visit: Payer: Self-pay

## 2018-03-20 ENCOUNTER — Encounter: Payer: Self-pay | Admitting: Adult Health

## 2018-03-20 VITALS — BP 118/88 | HR 73 | Resp 16 | Ht 73.0 in | Wt 222.0 lb

## 2018-03-20 DIAGNOSIS — F411 Generalized anxiety disorder: Secondary | ICD-10-CM

## 2018-03-20 DIAGNOSIS — R7309 Other abnormal glucose: Secondary | ICD-10-CM | POA: Diagnosis not present

## 2018-03-20 DIAGNOSIS — Z79899 Other long term (current) drug therapy: Secondary | ICD-10-CM | POA: Diagnosis not present

## 2018-03-20 DIAGNOSIS — E1165 Type 2 diabetes mellitus with hyperglycemia: Secondary | ICD-10-CM

## 2018-03-20 LAB — POCT URINE DRUG SCREEN
POC Amphetamine UR: NOT DETECTED
POC BENZODIAZEPINES UR: NOT DETECTED
POC Barbiturate UR: NOT DETECTED
POC Cocaine UR: NOT DETECTED
POC Marijuana UR: NOT DETECTED
POC Methadone UR: NOT DETECTED
POC Methamphetamine UR: NOT DETECTED
POC OPIATE UR: NOT DETECTED
POC OXYCODONE UR: NOT DETECTED
POC PHENCYCLIDINE UR: NOT DETECTED
POC TRICYCLICS UR: NOT DETECTED

## 2018-03-20 LAB — POCT GLYCOSYLATED HEMOGLOBIN (HGB A1C): HEMOGLOBIN A1C: 5.5 % (ref 4.0–5.6)

## 2018-03-20 MED ORDER — ALPRAZOLAM 0.25 MG PO TABS: 0.2500 mg | ORAL_TABLET | Freq: Every day | ORAL | 1 refills | Status: DC

## 2018-03-20 NOTE — Patient Instructions (Signed)

## 2018-03-20 NOTE — Progress Notes (Signed)
[facility] [address] [place], [place] [address]  Internal MEDICINE  Office Visit Note  Patient Name: Richard Vega  [id number]  [id number]  Date of Service: 03/20/2018  Chief Complaint  Patient presents with  . Medical Management of Chronic Issues    medication refills, 6 month follow up   . Anxiety    HPI Pt Is here for follow up on anxiety, and diabeties.  Pt reports he has been exercising and eating right to help with his blood sugar, also his fatty liver.  He denies any recent issues.  He continues to use xanax intermittently to help him sleep. He reports his mind races at night when he lays down, and he needs the xanax to help him get to sleep some nights.       belsomra  Current Medication: Outpatient Encounter Medications as of 03/20/2018  Medication Sig  . ALPRAZolam (XANAX) 0.25 MG tablet Take 1 tablet (0.25 mg total) by mouth 2 (two) times daily as needed for anxiety.  . cetirizine (ZYRTEC) 10 MG tablet Take 10 mg by mouth as needed for allergies.  . IBUPROFEN PO Take by mouth as needed.  Marland Kitchen albuterol (PROVENTIL) (2.5 MG/3ML) 0.083% nebulizer solution USE ONE VIAL VIA NEBULIZER EVERY 8 HOURS AS NEEDED   No facility-administered encounter medications on file as of 03/20/2018.     Surgical History: Past Surgical History:  Procedure Laterality Date  . TONSILLECTOMY    . TYMPANOSTOMY TUBE PLACEMENT     x2    Medical History: Past Medical History:  Diagnosis Date  . Allergy   . Liver disease 09/2016   enlarged  . Palpitation 12/07/2015  . Sleep-disordered breathing 12/07/2015    Family History: Family History  Problem Relation Age of Onset  . Lung cancer Father 29    Social History   Socioeconomic History  . Marital status: Married    Spouse name: Not on file  . Number of children: Not on file  . Years of education: Not on file  . Highest education level: Not on file  Occupational History  . Not on file  Social Needs  .  Financial resource strain: Not on file  . Food insecurity:    Worry: Not on file    Inability: Not on file  . Transportation needs:    Medical: Not on file    Non-medical: Not on file  Tobacco Use  . Smoking status: Former Smoker    Years: 3.00    Types: Cigarettes    Last attempt to quit: 01/21/2009    Years since quitting: 9.1  . Smokeless tobacco: Never Used  Substance and Sexual Activity  . Alcohol use: No    Comment: quit September 2018  . Drug use: No  . Sexual activity: Yes  Lifestyle  . Physical activity:    Days per week: Not on file    Minutes per session: Not on file  . Stress: Not on file  Relationships  . Social connections:    Talks on phone: Not on file    Gets together: Not on file    Attends religious service: Not on file    Active member of club or organization: Not on file    Attends meetings of clubs or organizations: Not on file    Relationship status: Not on file  . Intimate partner violence:    Fear of current or ex partner: Not on file    Emotionally abused: Not on file  Physically abused: Not on file    Forced sexual activity: Not on file  Other Topics Concern  . Not on file  Social History Narrative  . Not on file      Review of Systems  Constitutional: Negative.  Negative for chills, fatigue and unexpected weight change.  HENT: Negative.  Negative for congestion, rhinorrhea, sneezing and sore throat.   Eyes: Negative for redness.  Respiratory: Negative.  Negative for cough, chest tightness and shortness of breath.   Cardiovascular: Negative.  Negative for chest pain and palpitations.  Gastrointestinal: Negative.  Negative for abdominal pain, constipation, diarrhea, nausea and vomiting.  Endocrine: Negative.   Genitourinary: Negative.  Negative for dysuria and frequency.  Musculoskeletal: Negative.  Negative for arthralgias, back pain, joint swelling and neck pain.  Skin: Negative.  Negative for rash.  Allergic/Immunologic: Negative.    Neurological: Negative.  Negative for tremors and numbness.  Hematological: Negative for adenopathy. Does not bruise/bleed easily.  Psychiatric/Behavioral: Negative.  Negative for behavioral problems, sleep disturbance and suicidal ideas. The patient is not nervous/anxious.     Vital Signs: BP 118/88   Pulse 73   Resp 16   Ht 6\' 1"  (1.854 m)   Wt 222 lb (100.7 kg)   SpO2 96%   BMI 29.29 kg/m    Physical Exam Vitals signs and nursing note reviewed.  Constitutional:      General: He is not in acute distress.    Appearance: He is well-developed. He is not diaphoretic.  HENT:     Head: Normocephalic and atraumatic.     Mouth/Throat:     Pharynx: No oropharyngeal exudate.  Eyes:     Pupils: Pupils are equal, round, and reactive to light.  Neck:     Musculoskeletal: Normal range of motion and neck supple.     Thyroid: No thyromegaly.     Vascular: No JVD.     Trachea: No tracheal deviation.  Cardiovascular:     Rate and Rhythm: Normal rate and regular rhythm.     Heart sounds: Normal heart sounds. No murmur. No friction rub. No gallop.   Pulmonary:     Effort: Pulmonary effort is normal. No respiratory distress.     Breath sounds: Normal breath sounds. No wheezing or rales.  Chest:     Chest wall: No tenderness.  Abdominal:     Palpations: Abdomen is soft.     Tenderness: There is no abdominal tenderness. There is no guarding.  Musculoskeletal: Normal range of motion.  Lymphadenopathy:     Cervical: No cervical adenopathy.  Skin:    General: Skin is warm and dry.  Neurological:     Mental Status: He is alert and oriented to person, place, and time.     Cranial Nerves: No cranial nerve deficit.  Psychiatric:        Behavior: Behavior normal.        Thought Content: Thought content normal.        Judgment: Judgment normal.    Assessment/Plan: belsomra samples. 1. Encounter for long-term (current) use of medications Urine drug screen performed.  - POCT Urine Drug  Screen  2. Uncontrolled type 2 diabetes mellitus with hyperglycemia (HCC) HA1C 5.5 at this visit.  Pt remains well controlled with diet and exercise.  - POCT HgB A1C  3. High glucose Blood sugar well controlled.   4. Generalized anxiety disorder Patient is Xanax refilled at this time.  Reduced #230 and instructions to take 1 tablet a day as needed.  We will continue to follow in the future.  Belsomra samples given to patient as alternative to Xanax. - ALPRAZolam (XANAX) 0.25 MG tablet; Take 1 tablet (0.25 mg total) by mouth daily.  Dispense: 30 tablet; Refill: 1 General Counseling: Richard Vega verbalizes understanding of the findings of todays visit and agrees with plan of treatment. I have discussed any further diagnostic evaluation that may be needed or ordered today. We also reviewed his medications today. he has been encouraged to call the office with any questions or concerns that should arise related to todays visit.    Orders Placed This Encounter  Procedures  . POCT Urine Drug Screen  . POCT HgB A1C    No orders of the defined types were placed in this encounter.   Time spent: 25 Minutes   This patient was seen by Blima Ledger AGNP-C in Collaboration with Dr Lyndon Code as a part of collaborative care agreement     Johnna Acosta AGNP-C Internal medicine

## 2018-05-07 ENCOUNTER — Ambulatory Visit: Payer: Self-pay | Admitting: Adult Health

## 2018-05-29 ENCOUNTER — Ambulatory Visit: Payer: Self-pay | Admitting: Nurse Practitioner

## 2018-06-01 ENCOUNTER — Ambulatory Visit: Payer: Managed Care, Other (non HMO) | Admitting: Nurse Practitioner

## 2018-06-01 ENCOUNTER — Encounter: Payer: Self-pay | Admitting: Nurse Practitioner

## 2018-06-01 ENCOUNTER — Other Ambulatory Visit: Payer: Self-pay

## 2018-06-01 DIAGNOSIS — R5383 Other fatigue: Secondary | ICD-10-CM | POA: Diagnosis not present

## 2018-06-01 DIAGNOSIS — F411 Generalized anxiety disorder: Secondary | ICD-10-CM | POA: Diagnosis not present

## 2018-06-01 MED ORDER — ALPRAZOLAM 0.25 MG PO TABS: 0.2500 mg | ORAL_TABLET | Freq: Every day | ORAL | 1 refills | Status: DC

## 2018-06-01 NOTE — Progress Notes (Signed)
[facility] [address] [place], [place] [address]  Internal MEDICINE  Telephone Visit  Patient Name: Richard Vega  [id number]  [id number]  Date of Service: 06/21/2018  I connected with the patient at 3:56pm by webcam and verified the patients identity using two identifiers.   I discussed the limitations, risks, security and privacy concerns of performing an evaluation and management service by webcam and the availability of in person appointments. I also discussed with the patient that there may be a patient responsible charge related to the service.  The patient expressed understanding and agrees to proceed.    Chief Complaint  Patient presents with  . Telephone Assessment  . Telephone Screen  . Follow-up    review labs     The patient has been contacted via webcam for follow up visit due to concerns for spread of novel coronavirus. He has done well with reduction of alprazolam 0.25mg  daily as needed. States the he does not need to take this every day. Last prescription is still valid. Only tkes them when absolutely needs them and is unable to sleep due to racing thoughts.       Current Medication: Outpatient Encounter Medications as of 06/01/2018  Medication Sig  . albuterol (PROVENTIL) (2.5 MG/3ML) 0.083% nebulizer solution USE ONE VIAL VIA NEBULIZER EVERY 8 HOURS AS NEEDED  . ALPRAZolam (XANAX) 0.25 MG tablet Take 1 tablet (0.25 mg total) by mouth daily.  . cetirizine (ZYRTEC) 10 MG tablet Take 10 mg by mouth as needed for allergies.  . IBUPROFEN PO Take by mouth as needed.  . [DISCONTINUED] ALPRAZolam (XANAX) 0.25 MG tablet Take 1 tablet (0.25 mg total) by mouth daily.   No facility-administered encounter medications on file as of 06/01/2018.     Surgical History: Past Surgical History:  Procedure Laterality Date  . TONSILLECTOMY    . TYMPANOSTOMY TUBE PLACEMENT     x2    Medical History: Past Medical History:  Diagnosis Date  . Allergy   .  Liver disease 09/2016   enlarged  . Palpitation 12/07/2015  . Sleep-disordered breathing 12/07/2015    Family History: Family History  Problem Relation Age of Onset  . Lung cancer Father 72    Social History   Socioeconomic History  . Marital status: Married    Spouse name: Not on file  . Number of children: Not on file  . Years of education: Not on file  . Highest education level: Not on file  Occupational History  . Not on file  Social Needs  . Financial resource strain: Not on file  . Food insecurity:    Worry: Not on file    Inability: Not on file  . Transportation needs:    Medical: Not on file    Non-medical: Not on file  Tobacco Use  . Smoking status: Former Smoker    Years: 3.00    Types: Cigarettes    Last attempt to quit: 01/21/2009    Years since quitting: 9.4  . Smokeless tobacco: Never Used  Substance and Sexual Activity  . Alcohol use: No    Comment: quit September 2018  . Drug use: No  . Sexual activity: Yes  Lifestyle  . Physical activity:    Days per week: Not on file    Minutes per session: Not on file  . Stress: Not on file  Relationships  . Social connections:    Talks on phone: Not on file    Gets together: Not on  file    Attends religious service: Not on file    Active member of club or organization: Not on file    Attends meetings of clubs or organizations: Not on file    Relationship status: Not on file  . Intimate partner violence:    Fear of current or ex partner: Not on file    Emotionally abused: Not on file    Physically abused: Not on file    Forced sexual activity: Not on file  Other Topics Concern  . Not on file  Social History Narrative  . Not on file      Review of Systems  Constitutional: Negative for chills, fatigue and unexpected weight change.  HENT: Negative for congestion, rhinorrhea, sneezing and sore throat.   Respiratory: Negative for cough, chest tightness and shortness of breath.   Cardiovascular:  Negative for chest pain and palpitations.  Gastrointestinal: Negative for abdominal pain, constipation, diarrhea, nausea and vomiting.  Endocrine: Negative for cold intolerance, heat intolerance, polydipsia and polyuria.  Musculoskeletal: Negative for arthralgias, back pain, joint swelling and neck pain.  Skin: Negative for rash.  Neurological: Negative for dizziness, tremors, numbness and headaches.  Hematological: Negative for adenopathy. Does not bruise/bleed easily.  Psychiatric/Behavioral: Positive for sleep disturbance. Negative for behavioral problems and suicidal ideas. The patient is nervous/anxious.     Vital Signs: There were no vitals taken for this visit.   Observation/Objective:   The patient is alert and oriented. She is pleasant and answers all questions appropriately. Breathing is non-labored. She is in no acute distress at this time.    Assessment/Plan: 1. Other fatigue Improved. Continue to monitor.   2. Generalized anxiety disorder May continue to take alprazolam 0.25mg  at bedtime as needed. A new prescription was sent to his pharmacy.  - ALPRAZolam (XANAX) 0.25 MG tablet; Take 1 tablet (0.25 mg total) by mouth daily.  Dispense: 30 tablet; Refill: 1  General Counseling: Richard Vega verbalizes understanding of the findings of today's phone visit and agrees with plan of treatment. I have discussed any further diagnostic evaluation that may be needed or ordered today. We also reviewed his medications today. he has been encouraged to call the office with any questions or concerns that should arise related to todays visit.    This patient was seen by Vincent GrosHeather Akito Boomhower FNP Collaboration with Dr Lyndon CodeFozia M Khan as a part of collaborative care agreement  Meds ordered this encounter  Medications  . ALPRAZolam (XANAX) 0.25 MG tablet    Sig: Take 1 tablet (0.25 mg total) by mouth daily.    Dispense:  30 tablet    Refill:  1    Order Specific Question:   Supervising Provider     Answer:   Lyndon CodeKHAN, FOZIA M [[id number]]    Time spent: 8615 Minutes    Dr Lyndon CodeFozia M Khan Internal medicine

## 2018-06-21 DIAGNOSIS — R5383 Other fatigue: Secondary | ICD-10-CM | POA: Insufficient documentation

## 2018-06-21 DIAGNOSIS — F411 Generalized anxiety disorder: Secondary | ICD-10-CM | POA: Insufficient documentation

## 2018-06-24 DIAGNOSIS — J342 Deviated nasal septum: Secondary | ICD-10-CM | POA: Diagnosis not present

## 2018-06-24 DIAGNOSIS — J301 Allergic rhinitis due to pollen: Secondary | ICD-10-CM | POA: Diagnosis not present

## 2018-06-24 DIAGNOSIS — H6123 Impacted cerumen, bilateral: Secondary | ICD-10-CM | POA: Diagnosis not present

## 2018-06-25 DIAGNOSIS — J301 Allergic rhinitis due to pollen: Secondary | ICD-10-CM | POA: Diagnosis not present

## 2018-09-18 ENCOUNTER — Ambulatory Visit: Payer: Self-pay

## 2018-09-18 ENCOUNTER — Other Ambulatory Visit: Payer: Self-pay

## 2018-09-18 VITALS — BP 112/85 | HR 80 | Temp 98.1°F | Resp 16 | Ht 73.0 in | Wt 221.0 lb

## 2018-09-18 DIAGNOSIS — Z23 Encounter for immunization: Secondary | ICD-10-CM

## 2018-09-18 NOTE — Progress Notes (Signed)
Stepped on a nail at home.  Small puncture wound to left heel.  No signs of infection - no redness, swelling or drainage.  States nail wasn't rusty, but wasn't new.  States he cleansed foot at home with soap & water.  Presents todayn  Requesting a Tetanus injection because he knew he was overdue for one.  Advised to cleanse foot with soap & water daily & use OTC anatibiotic ointment prn.  Went over signs & symptoms of infection - fever, redness, swelling, streaking, purulent andor foul smelling drainage.  VIS for Tdap given.  AMD

## 2018-10-20 ENCOUNTER — Ambulatory Visit: Payer: Self-pay | Admitting: Nurse Practitioner

## 2018-10-21 ENCOUNTER — Ambulatory Visit: Payer: 59 | Admitting: Adult Health

## 2018-10-21 ENCOUNTER — Ambulatory Visit (INDEPENDENT_AMBULATORY_CARE_PROVIDER_SITE_OTHER): Payer: 59 | Admitting: Adult Health

## 2018-10-21 ENCOUNTER — Other Ambulatory Visit: Payer: Self-pay

## 2018-10-21 ENCOUNTER — Encounter: Payer: Self-pay | Admitting: Adult Health

## 2018-10-21 VITALS — BP 120/84 | HR 66 | Resp 16 | Ht 72.0 in | Wt 223.0 lb

## 2018-10-21 VITALS — BP 120/84 | HR 98 | Resp 16 | Ht 72.0 in | Wt 223.0 lb

## 2018-10-21 DIAGNOSIS — Z20822 Contact with and (suspected) exposure to covid-19: Secondary | ICD-10-CM

## 2018-10-21 DIAGNOSIS — Z20828 Contact with and (suspected) exposure to other viral communicable diseases: Secondary | ICD-10-CM

## 2018-10-21 DIAGNOSIS — Z0001 Encounter for general adult medical examination with abnormal findings: Secondary | ICD-10-CM

## 2018-10-21 DIAGNOSIS — Z79899 Other long term (current) drug therapy: Secondary | ICD-10-CM | POA: Diagnosis not present

## 2018-10-21 DIAGNOSIS — R300 Dysuria: Secondary | ICD-10-CM

## 2018-10-21 DIAGNOSIS — F411 Generalized anxiety disorder: Secondary | ICD-10-CM | POA: Diagnosis not present

## 2018-10-21 LAB — POCT URINE DRUG SCREEN
POC Amphetamine UR: NOT DETECTED
POC BENZODIAZEPINES UR: NOT DETECTED
POC Barbiturate UR: NOT DETECTED
POC Cocaine UR: NOT DETECTED
POC Ecstasy UR: NOT DETECTED
POC Marijuana UR: NOT DETECTED
POC Methadone UR: NOT DETECTED
POC Methamphetamine UR: NOT DETECTED
POC Opiate Ur: NOT DETECTED
POC Oxycodone UR: NOT DETECTED
POC PHENCYCLIDINE UR: NOT DETECTED
POC TRICYCLICS UR: NOT DETECTED

## 2018-10-21 NOTE — Progress Notes (Signed)
Duplicate note

## 2018-10-21 NOTE — Progress Notes (Signed)
[facility] [address] [place], [place] [address]  Internal MEDICINE  Office Visit Note  Patient Name: Richard Vega  [id number]  [id number]  Date of Service: 10/21/2018  Chief Complaint  Patient presents with  . Annual Exam  . Medical Management of Chronic Issues  . Anxiety     HPI Pt is here for routine health maintenance examination.  He is a well appearing 38 yo male.  He has a history that includes anxiety, and allergies and asthma.  He denies any current issues.  He does not smoke or use illicit drugs.  He does drink alcohol occasionally. He is concerned that he had a covid infection and would like to have the blood antibody test performed.   Current Medication: Outpatient Encounter Medications as of 10/21/2018  Medication Sig  . albuterol (PROVENTIL) (2.5 MG/3ML) 0.083% nebulizer solution USE ONE VIAL VIA NEBULIZER EVERY 8 HOURS AS NEEDED  . ALPRAZolam (XANAX) 0.25 MG tablet Take 1 tablet (0.25 mg total) by mouth daily.  . cetirizine (ZYRTEC) 10 MG tablet Take 10 mg by mouth as needed for allergies.  . fluticasone (FLONASE) 50 MCG/ACT nasal spray SPRAY 2 SPRAYS INTO EACH NOSTRIL EVERY DAY  . IBUPROFEN PO Take by mouth as needed.   No facility-administered encounter medications on file as of 10/21/2018.     Surgical History: Past Surgical History:  Procedure Laterality Date  . TONSILLECTOMY    . TYMPANOSTOMY TUBE PLACEMENT     x2    Medical History: Past Medical History:  Diagnosis Date  . Allergy   . Liver disease 09/2016   enlarged  . Palpitation 12/07/2015  . Sleep-disordered breathing 12/07/2015    Family History: Family History  Problem Relation Age of Onset  . Lung cancer Father 3      Review of Systems  Constitutional: Negative.  Negative for chills, fatigue and unexpected weight change.  HENT: Negative.  Negative for congestion, rhinorrhea, sneezing and sore throat.   Eyes: Negative for redness.  Respiratory: Negative.   Negative for cough, chest tightness and shortness of breath.   Cardiovascular: Negative.  Negative for chest pain and palpitations.  Gastrointestinal: Negative.  Negative for abdominal pain, constipation, diarrhea, nausea and vomiting.  Endocrine: Negative.   Genitourinary: Negative.  Negative for dysuria and frequency.  Musculoskeletal: Negative.  Negative for arthralgias, back pain, joint swelling and neck pain.  Skin: Negative.  Negative for rash.  Allergic/Immunologic: Negative.   Neurological: Negative.  Negative for tremors and numbness.  Hematological: Negative for adenopathy. Does not bruise/bleed easily.  Psychiatric/Behavioral: Negative.  Negative for behavioral problems, sleep disturbance and suicidal ideas. The patient is not nervous/anxious.      Vital Signs: BP 120/84   Pulse 66   Resp 16   Ht 6' (1.829 m)   Wt 223 lb (101.2 kg)   SpO2 98%   BMI 30.24 kg/m    Physical Exam Vitals signs and nursing note reviewed.  Constitutional:      General: He is not in acute distress.    Appearance: He is well-developed. He is not diaphoretic.  HENT:     Head: Normocephalic and atraumatic.     Mouth/Throat:     Pharynx: No oropharyngeal exudate.  Eyes:     Pupils: Pupils are equal, round, and reactive to light.  Neck:     Musculoskeletal: Normal range of motion and neck supple.     Thyroid: No thyromegaly.     Vascular: No JVD.  Trachea: No tracheal deviation.  Cardiovascular:     Rate and Rhythm: Normal rate and regular rhythm.     Heart sounds: Normal heart sounds. No murmur. No friction rub. No gallop.   Pulmonary:     Effort: Pulmonary effort is normal. No respiratory distress.     Breath sounds: Normal breath sounds. No wheezing or rales.  Chest:     Chest wall: No tenderness.  Abdominal:     Palpations: Abdomen is soft.     Tenderness: There is no abdominal tenderness. There is no guarding.  Musculoskeletal: Normal range of motion.  Lymphadenopathy:      Cervical: No cervical adenopathy.  Skin:    General: Skin is warm and dry.  Neurological:     Mental Status: He is alert and oriented to person, place, and time.     Cranial Nerves: No cranial nerve deficit.  Psychiatric:        Behavior: Behavior normal.        Thought Content: Thought content normal.        Judgment: Judgment normal.      LABS: Recent Results (from the past 2160 hour(s))  POCT Urine Drug Screen     Status: None   Collection Time: 10/21/18 10:13 AM  Result Value Ref Range   POC METHAMPHETAMINE UR None Detected None Detected   POC Opiate Ur None Detected None Detected   POC Barbiturate UR None Detected None Detected   POC Amphetamine UR None Detected None Detected   POC Oxycodone UR None Detected None Detected   POC Cocaine UR None Detected None Detected   POC Ecstasy UR None Detected None Detected   POC TRICYCLICS UR None Detected None Detected   POC PHENCYCLIDINE UR None Detected None Detected   POC MARIJUANA UR None Detected None Detected   POC METHADONE UR None Detected None Detected   POC BENZODIAZEPINES UR None Detected None Detected   URINE TEMPERATURE     POC DRUG SCREEN OXIDANTS URINE     POC SPECIFIC GRAVITY URINE     POC PH URINE     Methylenedioxyamphetamine       Assessment/Plan: 1. Encounter for general adult medical examination with abnormal findings Up to date on PHM.  - CBC with Differential/Platelet - Lipid Panel With LDL/HDL Ratio - TSH - T4, free - Comprehensive metabolic panel  2. Encounter for long-term (current) use of medications Drug test negative for benzos, pt has not taken any in a few weeks. He has been doing well.  3. Close Exposure to Covid-19 Virus - SARS-CoV-2 Antibody, IgM  4. Dysuria - UA/M w/rflx Culture, Routine  General Counseling: Cregg verbalizes understanding of the findings of todays visit and agrees with plan of treatment. I have discussed any further diagnostic evaluation that may be needed or  ordered today. We also reviewed his medications today. he has been encouraged to call the office with any questions or concerns that should arise related to todays visit.   Orders Placed This Encounter  Procedures  . UA/M w/rflx Culture, Routine    No orders of the defined types were placed in this encounter.   Time spent: 30 Minutes   This patient was seen by Orson Gear AGNP-C in Collaboration with Dr Lavera Guise as a part of collaborative care agreement    Kendell Bane AGNP-C Internal Medicine

## 2018-10-22 LAB — MICROSCOPIC EXAMINATION
Casts: NONE SEEN /lpf
Epithelial Cells (non renal): NONE SEEN /hpf (ref 0–10)

## 2018-10-22 LAB — UA/M W/RFLX CULTURE, ROUTINE
Bilirubin, UA: NEGATIVE
Glucose, UA: NEGATIVE
Ketones, UA: NEGATIVE
Leukocytes,UA: NEGATIVE
Nitrite, UA: NEGATIVE
Protein,UA: NEGATIVE
RBC, UA: NEGATIVE
Specific Gravity, UA: 1.021 (ref 1.005–1.030)
Urobilinogen, Ur: 0.2 mg/dL (ref 0.2–1.0)
pH, UA: 5 (ref 5.0–7.5)

## 2018-10-23 DIAGNOSIS — Z79899 Other long term (current) drug therapy: Secondary | ICD-10-CM | POA: Diagnosis not present

## 2018-10-23 DIAGNOSIS — Z20828 Contact with and (suspected) exposure to other viral communicable diseases: Secondary | ICD-10-CM | POA: Diagnosis not present

## 2018-10-25 LAB — CBC WITH DIFFERENTIAL/PLATELET
Basophils Absolute: 0.1 10*3/uL (ref 0.0–0.2)
Basos: 2 %
EOS (ABSOLUTE): 0.1 10*3/uL (ref 0.0–0.4)
Eos: 2 %
Hematocrit: 45.9 % (ref 37.5–51.0)
Hemoglobin: 15.7 g/dL (ref 13.0–17.7)
Immature Grans (Abs): 0 10*3/uL (ref 0.0–0.1)
Immature Granulocytes: 0 %
Lymphocytes Absolute: 1.6 10*3/uL (ref 0.7–3.1)
Lymphs: 34 %
MCH: 31 pg (ref 26.6–33.0)
MCHC: 34.2 g/dL (ref 31.5–35.7)
MCV: 91 fL (ref 79–97)
Monocytes Absolute: 0.3 10*3/uL (ref 0.1–0.9)
Monocytes: 6 %
Neutrophils Absolute: 2.7 10*3/uL (ref 1.4–7.0)
Neutrophils: 56 %
Platelets: 199 10*3/uL (ref 150–450)
RBC: 5.06 x10E6/uL (ref 4.14–5.80)
RDW: 13 % (ref 11.6–15.4)
WBC: 4.8 10*3/uL (ref 3.4–10.8)

## 2018-10-25 LAB — LIPID PANEL WITH LDL/HDL RATIO
Cholesterol, Total: 165 mg/dL (ref 100–199)
HDL: 33 mg/dL — ABNORMAL LOW (ref >39)
LDL Chol Calc (NIH): 115 mg/dL — ABNORMAL HIGH (ref 0–99)
LDL/HDL Ratio: 3.5 ratio (ref 0.0–3.6)
Triglycerides: 89 mg/dL (ref 0–149)
VLDL Cholesterol Cal: 17 mg/dL (ref 5–40)

## 2018-10-25 LAB — COMPREHENSIVE METABOLIC PANEL
ALT: 17 IU/L (ref 0–44)
AST: 17 IU/L (ref 0–40)
Albumin/Globulin Ratio: 2.8 — ABNORMAL HIGH (ref 1.2–2.2)
Albumin: 5 g/dL (ref 4.0–5.0)
Alkaline Phosphatase: 66 IU/L (ref 39–117)
BUN/Creatinine Ratio: 12 (ref 9–20)
BUN: 13 mg/dL (ref 6–20)
Bilirubin Total: 0.4 mg/dL (ref 0.0–1.2)
CO2: 22 mmol/L (ref 20–29)
Calcium: 9.4 mg/dL (ref 8.7–10.2)
Chloride: 106 mmol/L (ref 96–106)
Creatinine, Ser: 1.13 mg/dL (ref 0.76–1.27)
GFR calc Af Amer: 94 mL/min/{1.73_m2} (ref >59)
GFR calc non Af Amer: 81 mL/min/{1.73_m2} (ref >59)
Globulin, Total: 1.8 g/dL (ref 1.5–4.5)
Glucose: 102 mg/dL — ABNORMAL HIGH (ref 65–99)
Potassium: 4.6 mmol/L (ref 3.5–5.2)
Sodium: 140 mmol/L (ref 134–144)
Total Protein: 6.8 g/dL (ref 6.0–8.5)

## 2018-10-25 LAB — TSH: TSH: 3.87 u[IU]/mL (ref 0.450–4.500)

## 2018-10-25 LAB — SARS-COV-2 ANTIBODY, IGM: SARS-CoV-2 Antibody, IgM: NEGATIVE

## 2018-10-25 LAB — T4, FREE: Free T4: 1.16 ng/dL (ref 0.82–1.77)

## 2018-10-28 ENCOUNTER — Telehealth: Payer: Self-pay

## 2018-10-28 NOTE — Telephone Encounter (Signed)
Per Quita Skye let pt know that Labs look good, Cholesterol is a tiny bit high, he should continue to exercise, and try to limit his fatty meat ingestion.

## 2018-12-03 ENCOUNTER — Ambulatory Visit: Payer: Self-pay | Admitting: Adult Health

## 2018-12-03 ENCOUNTER — Ambulatory Visit: Payer: BLUE CROSS/BLUE SHIELD | Admitting: Adult Health

## 2018-12-23 ENCOUNTER — Other Ambulatory Visit: Payer: Self-pay | Admitting: Adult Health

## 2018-12-23 ENCOUNTER — Telehealth: Payer: Self-pay

## 2018-12-23 DIAGNOSIS — F411 Generalized anxiety disorder: Secondary | ICD-10-CM

## 2018-12-23 MED ORDER — ALPRAZOLAM 0.25 MG PO TABS: 0.2500 mg | ORAL_TABLET | Freq: Every day | ORAL | 0 refills | Status: DC

## 2018-12-23 NOTE — Progress Notes (Signed)
Sent Refill of patients Xanax, he has not had a refill since may 2020, and did not need one at last visit.

## 2018-12-23 NOTE — Telephone Encounter (Signed)
rx sent

## 2019-01-20 ENCOUNTER — Telehealth: Payer: Self-pay

## 2019-01-20 NOTE — Telephone Encounter (Signed)
CONFIRMED AND SCREENED FOR 01-21-19 OV TIME APPT WAS SCHEDULED.

## 2019-01-21 ENCOUNTER — Ambulatory Visit (INDEPENDENT_AMBULATORY_CARE_PROVIDER_SITE_OTHER): Payer: 59 | Admitting: Adult Health

## 2019-01-21 ENCOUNTER — Encounter: Payer: Self-pay | Admitting: Adult Health

## 2019-01-21 ENCOUNTER — Other Ambulatory Visit: Payer: Self-pay

## 2019-01-21 VITALS — BP 122/89 | HR 70 | Resp 16 | Ht 72.0 in | Wt 228.0 lb

## 2019-01-21 DIAGNOSIS — F411 Generalized anxiety disorder: Secondary | ICD-10-CM

## 2019-01-21 DIAGNOSIS — R41840 Attention and concentration deficit: Secondary | ICD-10-CM | POA: Diagnosis not present

## 2019-01-21 DIAGNOSIS — R69 Illness, unspecified: Secondary | ICD-10-CM | POA: Diagnosis not present

## 2019-01-21 MED ORDER — AMPHETAMINE-DEXTROAMPHETAMINE 10 MG PO TABS: 10.0000 mg | ORAL_TABLET | Freq: Two times a day (BID) | ORAL | 0 refills | Status: DC

## 2019-01-21 NOTE — Progress Notes (Signed)
[facility] [address] [place], [place] [address]  Internal MEDICINE  Office Visit Note  Patient Name: Richard OysterJonathan T Vega  [id number]/28/[id number]  [id number]  Date of Service: 01/21/2019  Chief Complaint  Patient presents with  . Medication Management    discuss a poss new med, will let you know which one     HPI Pt is here to discuss medication.  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 He has failed the test twice and only has one more chance or he will  Lose his job.  He would like to try some aderall to help him study, and prepare.  He does not want to continue it beyond his test, he would just like some help to focus on the immense amount of information he has to go over.     Current Medication: Outpatient Encounter Medications as of 01/21/2019  Medication Sig  . albuterol (PROVENTIL) (2.5 MG/3ML) 0.083% nebulizer solution USE ONE VIAL VIA NEBULIZER EVERY 8 HOURS AS NEEDED  . ALPRAZolam (XANAX) 0.25 MG tablet Take 1 tablet (0.25 mg total) by mouth daily.  . cetirizine (ZYRTEC) 10 MG tablet Take 10 mg by mouth as needed for allergies.  . fluticasone (FLONASE) 50 MCG/ACT nasal spray SPRAY 2 SPRAYS INTO EACH NOSTRIL EVERY DAY  . IBUPROFEN PO Take by mouth as needed.  Marland Kitchen. amphetamine-dextroamphetamine (ADDERALL) 10 MG tablet Take 1 tablet (10 mg total) by mouth 2 (two) times daily.   No facility-administered encounter medications on file as of 01/21/2019.    Surgical History: Past Surgical History:  Procedure Laterality Date  . TONSILLECTOMY    . TYMPANOSTOMY TUBE PLACEMENT     x2    Medical History: Past Medical History:  Diagnosis Date  . Allergy   . Liver disease 09/2016   enlarged  . Palpitation 12/07/2015  . Sleep-disordered breathing 12/07/2015    Family History: Family History  Problem Relation Age of Onset  . Lung cancer Father [id number]    Social History   Socioeconomic History  . Marital status: Married     Spouse name: Not on file  . Number of children: Not on file  . Years of education: Not on file  . Highest education level: Not on file  Occupational History  . Not on file  Tobacco Use  . Smoking status: Former Smoker    Years: 3.00    Types: Cigarettes    Quit date: 01/21/2009    Years since quitting: 10.0  . Smokeless tobacco: Never Used  Substance and Sexual Activity  . Alcohol use: No    Comment: quit September 2018  . Drug use: No  . Sexual activity: Yes  Other Topics Concern  . Not on file  Social History Narrative  . Not on file   Social Determinants of Health   Financial Resource Strain:   . Difficulty of Paying Living Expenses: Not on file  Food Insecurity:   . Worried About Programme researcher, broadcasting/film/videounning Out of Food in the Last Year: Not on file  . Ran Out of Food in the Last Year: Not on file  Transportation Needs:   . Lack of Transportation (Medical): Not on file  . Lack of Transportation (Non-Medical): Not on file  Physical Activity:   . Days of Exercise per Week: Not on file  . Minutes of Exercise per Session: Not on file  Stress:   . Feeling of Stress : Not on file  Social Connections:   . Frequency of Communication with Friends and Family: Not on file  . Frequency of Social Gatherings with Friends and Family: Not on file  . Attends Religious Services: Not on file  . Active Member of Clubs or Organizations: Not on file  . Attends Banker Meetings: Not on file  . Marital Status: Not on file  Intimate Partner Violence:   . Fear of Current or Ex-Partner: Not on file  . Emotionally Abused: Not on file  . Physically Abused: Not on file  . Sexually Abused: Not on file      Review of Systems  Constitutional: Negative.  Negative for chills, fatigue and unexpected weight change.  HENT: Negative.  Negative for congestion, rhinorrhea, sneezing and sore throat.   Eyes: Negative for redness.  Respiratory: Negative.  Negative for cough, chest tightness and shortness of  breath.   Cardiovascular: Negative.  Negative for chest pain and palpitations.  Gastrointestinal: Negative.  Negative for abdominal pain, constipation, diarrhea, nausea and vomiting.  Endocrine: Negative.   Genitourinary: Negative.  Negative for dysuria and frequency.  Musculoskeletal: Negative.  Negative for arthralgias, back pain, joint swelling and neck pain.  Skin: Negative.  Negative for rash.  Allergic/Immunologic: Negative.   Neurological: Negative.  Negative for tremors and numbness.  Hematological: Negative for adenopathy. Does not bruise/bleed easily.  Psychiatric/Behavioral: Negative.  Negative for behavioral problems, sleep disturbance and suicidal ideas. The patient is not nervous/anxious.     Vital Signs: BP 122/89   Pulse 70   Resp 16   Ht 6' (1.829 m)   Wt 228 lb (103.4 kg)   SpO2 99%   BMI 30.92 kg/m    Physical Exam Vitals and nursing note reviewed.  Constitutional:      General: He is not in acute distress.    Appearance: He is well-developed. He is not diaphoretic.  HENT:     Head: Normocephalic and atraumatic.     Mouth/Throat:     Pharynx: No oropharyngeal exudate.  Eyes:     Pupils: Pupils are equal, round, and reactive to light.  Neck:     Thyroid: No thyromegaly.     Vascular: No JVD.     Trachea: No tracheal deviation.  Cardiovascular:     Rate and Rhythm: Normal rate and regular rhythm.     Heart sounds: Normal heart sounds. No murmur. No friction rub. No gallop.   Pulmonary:     Effort: Pulmonary effort is normal. No respiratory distress.     Breath sounds: Normal breath sounds. No wheezing or rales.  Chest:     Chest wall: No tenderness.  Abdominal:     Palpations: Abdomen is soft.     Tenderness: There is no abdominal tenderness. There is no guarding.  Musculoskeletal:        General: Normal range of motion.     Cervical back: Normal range of motion and neck supple.  Lymphadenopathy:     Cervical: No cervical adenopathy.  Skin:     General: Skin is warm and dry.  Neurological:     Mental Status: He is alert and oriented to person, place, and time.     Cranial Nerves: No cranial nerve deficit.  Psychiatric:        Behavior: Behavior normal.        Thought Content: Thought content normal.        Judgment: Judgment normal.     Assessment/Plan: 1. Concentration deficit Refilled Controlled medications today.  Reviewed risks and possible side effects associated with taking Stimulants. Combination of these drugs with other psychotropic medications could cause dizziness and drowsiness. Pt needs to Monitor symptoms and exercise caution in driving and operating heavy machinery to avoid damages to oneself, to others and to the surroundings. Patient verbalized understanding in this matter. Dependence and abuse for these drugs will be monitored closely. A Controlled substance policy and procedure is on file which allows [facility] to order a urine drug screen test at any visit. Patient understands and agrees with the plan.. - amphetamine-dextroamphetamine (ADDERALL) 10 MG tablet; Take 1 tablet (10 mg total) by mouth 2 (two) times daily.  Dispense: 60 tablet; Refill: 0  2. Generalized anxiety disorder Doing well at this time.  He has not used xanax in awhile.  General Counseling: gurley climer understanding of the findings of todays visit and agrees with plan of treatment. I have discussed any further diagnostic evaluation that may be needed or ordered today. We also reviewed his medications today. he has been encouraged to call the office with any questions or concerns that should arise related to todays visit.    No orders of the defined types were placed in this encounter.   Meds ordered this encounter  Medications  . amphetamine-dextroamphetamine (ADDERALL) 10 MG tablet    Sig: Take 1 tablet (10 mg total) by mouth 2 (two) times daily.    Dispense:  60 tablet    Refill:  0    Time spent: 25  Minutes   This patient was seen by Orson Gear AGNP-C in Collaboration with Dr Lavera Guise as a part of collaborative care agreement     Kendell Bane AGNP-C Internal medicine

## 2019-02-17 ENCOUNTER — Telehealth: Payer: Self-pay

## 2019-02-18 ENCOUNTER — Other Ambulatory Visit: Payer: Self-pay | Admitting: Adult Health

## 2019-02-18 MED ORDER — AMPHETAMINE-DEXTROAMPHET ER 15 MG PO CP24: 15.0000 mg | ORAL_CAPSULE | ORAL | 0 refills | Status: DC

## 2019-02-18 NOTE — Telephone Encounter (Signed)
SPOKE WITH ADAM AND HE SENT IN A 15 MG 24 HR ADDERALL RX TO PHARMACY. PT WAS NOTIFIED.

## 2019-02-18 NOTE — Progress Notes (Signed)
Pt unable to sleep with taking 10mg  adderal bid.  He reports the morning does only helps for about an hour.  Sent 15mg  XR dose to pharmacy to try.

## 2019-02-19 ENCOUNTER — Telehealth: Payer: Self-pay

## 2019-02-19 NOTE — Telephone Encounter (Signed)
Called confirmed virtual visit with patient. klh 

## 2019-02-23 ENCOUNTER — Ambulatory Visit: Payer: 59 | Admitting: Adult Health

## 2019-02-23 ENCOUNTER — Encounter: Payer: Self-pay | Admitting: Adult Health

## 2019-02-23 VITALS — Ht 73.0 in | Wt 225.0 lb

## 2019-02-23 DIAGNOSIS — R41840 Attention and concentration deficit: Secondary | ICD-10-CM | POA: Diagnosis not present

## 2019-02-23 DIAGNOSIS — R69 Illness, unspecified: Secondary | ICD-10-CM | POA: Diagnosis not present

## 2019-02-23 DIAGNOSIS — F411 Generalized anxiety disorder: Secondary | ICD-10-CM | POA: Diagnosis not present

## 2019-02-23 NOTE — Progress Notes (Signed)
[facility] [place], [place] [address]  Internal MEDICINE  Telephone Visit  Patient Name: Richard Vega  [id number]  [id number]  Date of Service: 02/23/2019  I connected with the patient at 915 by telephone and verified the patients identity using two identifiers.   I discussed the limitations, risks, security and privacy concerns of performing an evaluation and management service by telephone and the availability of in person appointments. I also discussed with the patient that there may be a patient responsible charge related to the service.  The patient expressed understanding and agrees to proceed.    Chief Complaint  Patient presents with  . Telephone Assessment  . Telephone Screen  . Allergies    HPI  Pt is seen today via video. 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  He initially was prescribed 10mg  tablets and was unable to sleep.  They were switched to 15mg  XR capsules and he is having much better results at this time.  He is not having any difficulty sleeping.  He feels focused when he is working, and when he is studying.  His state exam has been pushed back due to work.  At this time he will stop taking the adderall, and will let us know when the state test is scheduled, and if he needs a refill at that time we will provide it.     Current Medication: Outpatient Encounter Medications as of 02/23/2019  Medication Sig  . albuterol (PROVENTIL) (2.5 MG/3ML) 0.083% nebulizer solution USE ONE VIAL VIA NEBULIZER EVERY 8 HOURS AS NEEDED  . ALPRAZolam (XANAX) 0.25 MG tablet Take 1 tablet (0.25 mg total) by mouth daily.  Marland Kitchen amphetamine-dextroamphetamine (ADDERALL XR) 15 MG 24 hr capsule Take 1 capsule by mouth every morning.  . cetirizine (ZYRTEC) 10 MG tablet Take 10 mg by mouth as needed for allergies.  . fluticasone (FLONASE) 50 MCG/ACT nasal spray SPRAY 2 SPRAYS INTO EACH NOSTRIL EVERY DAY  . IBUPROFEN PO Take by mouth as  needed.   No facility-administered encounter medications on file as of 02/23/2019.    Surgical History: Past Surgical History:  Procedure Laterality Date  . TONSILLECTOMY    . TYMPANOSTOMY TUBE PLACEMENT     x2    Medical History: Past Medical History:  Diagnosis Date  . Allergy   . Liver disease 09/2016   enlarged  . Palpitation 12/07/2015  . Sleep-disordered breathing 12/07/2015    Family History: Family History  Problem Relation Age of Onset  . Lung cancer Father 73    Social History   Socioeconomic History  . Marital status: Married    Spouse name: Not on file  . Number of children: Not on file  . Years of education: Not on file  . Highest education level: Not on file  Occupational History  . Not on file  Tobacco Use  . Smoking status: Former Smoker    Years: 3.00    Types: Cigarettes    Quit date: 01/21/2009    Years since quitting: 10.0  . Smokeless tobacco: Never Used  Substance and Sexual Activity  . Alcohol use: No    Comment: quit September 2018  . Drug use: No  . Sexual activity: Yes  Other Topics Concern  . Not on file  Social History Narrative  . Not on file   Social Determinants of Health   Financial Resource Strain:   . Difficulty of Paying Living Expenses: Not  on file  Food Insecurity:   . Worried About Programme researcher, broadcasting/film/video in the Last Year: Not on file  . Ran Out of Food in the Last Year: Not on file  Transportation Needs:   . Lack of Transportation (Medical): Not on file  . Lack of Transportation (Non-Medical): Not on file  Physical Activity:   . Days of Exercise per Week: Not on file  . Minutes of Exercise per Session: Not on file  Stress:   . Feeling of Stress : Not on file  Social Connections:   . Frequency of Communication with Friends and Family: Not on file  . Frequency of Social Gatherings with Friends and Family: Not on file  . Attends Religious Services: Not on file  . Active Member of Clubs or Organizations: Not on file   . Attends Banker Meetings: Not on file  . Marital Status: Not on file  Intimate Partner Violence:   . Fear of Current or Ex-Partner: Not on file  . Emotionally Abused: Not on file  . Physically Abused: Not on file  . Sexually Abused: Not on file      Review of Systems  Constitutional: Negative.  Negative for chills, fatigue and unexpected weight change.  HENT: Negative.  Negative for congestion, rhinorrhea, sneezing and sore throat.   Eyes: Negative for redness.  Respiratory: Negative.  Negative for cough, chest tightness and shortness of breath.   Cardiovascular: Negative.  Negative for chest pain and palpitations.  Gastrointestinal: Negative.  Negative for abdominal pain, constipation, diarrhea, nausea and vomiting.  Endocrine: Negative.   Genitourinary: Negative.  Negative for dysuria and frequency.  Musculoskeletal: Negative.  Negative for arthralgias, back pain, joint swelling and neck pain.  Skin: Negative.  Negative for rash.  Allergic/Immunologic: Negative.   Neurological: Negative.  Negative for tremors and numbness.  Hematological: Negative for adenopathy. Does not bruise/bleed easily.  Psychiatric/Behavioral: Negative.  Negative for behavioral problems, sleep disturbance and suicidal ideas. The patient is not nervous/anxious.     Vital Signs: Ht 6\' 1"  (1.854 m)   Wt 225 lb (102.1 kg)   BMI 29.69 kg/m    Observation/Objective:  Well appearing, NAD noted   Assessment/Plan: 1. Concentration deficit Stable at this time.  Good results with adderall trial.  He will stop now, and resume when its time to take his class, and subsequent state test.   2. Generalized anxiety disorder Well controlled, NAD noted.   General Counseling: daniela hernan understanding of the findings of today's phone visit and agrees with plan of treatment. I have discussed any further diagnostic evaluation that may be needed or ordered today. We also reviewed his  medications today. he has been encouraged to call the office with any questions or concerns that should arise related to todays visit.    No orders of the defined types were placed in this encounter.   No orders of the defined types were placed in this encounter.   Time spent: 20 Minutes    Willia Craze [facility] Internal medicine

## 2019-03-19 ENCOUNTER — Ambulatory Visit: Payer: BLUE CROSS/BLUE SHIELD | Attending: Internal Medicine

## 2019-03-19 DIAGNOSIS — Z20822 Contact with and (suspected) exposure to covid-19: Secondary | ICD-10-CM | POA: Insufficient documentation

## 2019-03-20 LAB — NOVEL CORONAVIRUS, NAA: SARS-CoV-2, NAA: NOT DETECTED

## 2019-04-01 DIAGNOSIS — K4090 Unilateral inguinal hernia, without obstruction or gangrene, not specified as recurrent: Secondary | ICD-10-CM | POA: Diagnosis not present

## 2019-08-05 ENCOUNTER — Telehealth: Payer: Self-pay

## 2019-08-05 NOTE — Telephone Encounter (Signed)
Confirmed appointment on 08/09/2019 and screened for covid. [facility]  

## 2019-08-09 ENCOUNTER — Ambulatory Visit (INDEPENDENT_AMBULATORY_CARE_PROVIDER_SITE_OTHER): Payer: 59 | Admitting: Adult Health

## 2019-08-09 ENCOUNTER — Encounter: Payer: Self-pay | Admitting: Adult Health

## 2019-08-09 ENCOUNTER — Other Ambulatory Visit: Payer: Self-pay

## 2019-08-09 VITALS — BP 115/80 | HR 69 | Temp 97.5°F | Resp 16 | Ht 73.0 in | Wt 215.4 lb

## 2019-08-09 DIAGNOSIS — T7840XD Allergy, unspecified, subsequent encounter: Secondary | ICD-10-CM

## 2019-08-09 DIAGNOSIS — F411 Generalized anxiety disorder: Secondary | ICD-10-CM

## 2019-08-09 DIAGNOSIS — E1165 Type 2 diabetes mellitus with hyperglycemia: Secondary | ICD-10-CM

## 2019-08-09 DIAGNOSIS — Z87898 Personal history of other specified conditions: Secondary | ICD-10-CM | POA: Diagnosis not present

## 2019-08-09 DIAGNOSIS — R5383 Other fatigue: Secondary | ICD-10-CM | POA: Diagnosis not present

## 2019-08-09 DIAGNOSIS — R69 Illness, unspecified: Secondary | ICD-10-CM | POA: Diagnosis not present

## 2019-08-09 MED ORDER — FLUTICASONE PROPIONATE 50 MCG/ACT NA SUSP: NASAL | 1 refills | Status: DC

## 2019-08-09 NOTE — Progress Notes (Signed)
[facility] [address] [place], [place] [address]  Internal MEDICINE  Office Visit Note  Patient Name: Richard Vega  [id number]  [id number]  Date of Service: 08/09/2019  Chief Complaint  Patient presents with  . Follow-up    HPI  Pt is here for follow up. Overall he is doing well.  He denies any recent issues. 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  His A1C is checked today due to a history of Pre-DM.  His A1C today is 5.3.  He denies any new or worsening complaints. He does have a history of allergies, he uses flonase and zyrtec for.  Denies any overt symptoms.    Current Medication: Outpatient Encounter Medications as of 08/09/2019  Medication Sig  . albuterol (PROVENTIL) (2.5 MG/3ML) 0.083% nebulizer solution USE ONE VIAL VIA NEBULIZER EVERY 8 HOURS AS NEEDED  . ALPRAZolam (XANAX) 0.25 MG tablet Take 1 tablet (0.25 mg total) by mouth daily.  Marland Kitchen amphetamine-dextroamphetamine (ADDERALL XR) 15 MG 24 hr capsule Take 1 capsule by mouth every morning.  . cetirizine (ZYRTEC) 10 MG tablet Take 10 mg by mouth as needed for allergies.  . fluticasone (FLONASE) 50 MCG/ACT nasal spray SPRAY 2 SPRAYS INTO EACH NOSTRIL EVERY DAY  . IBUPROFEN PO Take by mouth as needed.   No facility-administered encounter medications on file as of 08/09/2019.    Surgical History: Past Surgical History:  Procedure Laterality Date  . TONSILLECTOMY    . TYMPANOSTOMY TUBE PLACEMENT     x2    Medical History: Past Medical History:  Diagnosis Date  . Allergy   . Liver disease 09/2016   enlarged  . Palpitation 12/07/2015  . Sleep-disordered breathing 12/07/2015    Family History: Family History  Problem Relation Age of Onset  . Lung cancer Father 20    Social History   Socioeconomic History  . Marital status: Married    Spouse name: Not on file  . Number of children: Not on file  . Years of education: Not on file  . Highest  education level: Not on file  Occupational History  . Not on file  Tobacco Use  . Smoking status: Former Smoker    Years: 3.00    Types: Cigarettes    Quit date: 01/21/2009    Years since quitting: 10.5  . Smokeless tobacco: Never Used  Vaping Use  . Vaping Use: Never used  Substance and Sexual Activity  . Alcohol use: No    Comment: quit September 2018  . Drug use: No  . Sexual activity: Yes  Other Topics Concern  . Not on file  Social History Narrative  . Not on file   Social Determinants of Health   Financial Resource Strain:   . Difficulty of Paying Living Expenses:   Food Insecurity:   . Worried About Programme researcher, broadcasting/film/video in the Last Year:   . Barista in the Last Year:   Transportation Needs:   . Freight forwarder (Medical):   Marland Kitchen Lack of Transportation (Non-Medical):   Physical Activity:   . Days of Exercise per Week:   . Minutes of Exercise per Session:   Stress:   . Feeling of Stress :   Social Connections:   . Frequency of Communication with Friends and Family:   . Frequency of Social Gatherings with Friends and Family:   . Attends Religious Services:   . Active Member of Clubs  or Organizations:   . Attends Banker Meetings:   Marland Kitchen Marital Status:   Intimate Partner Violence:   . Fear of Current or Ex-Partner:   . Emotionally Abused:   Marland Kitchen Physically Abused:   . Sexually Abused:       Review of Systems  Constitutional: Negative.  Negative for chills, fatigue and unexpected weight change.  HENT: Negative.  Negative for congestion, rhinorrhea, sneezing and sore throat.   Eyes: Negative for redness.  Respiratory: Negative.  Negative for cough, chest tightness and shortness of breath.   Cardiovascular: Negative.  Negative for chest pain and palpitations.  Gastrointestinal: Negative.  Negative for abdominal pain, constipation, diarrhea, nausea and vomiting.  Endocrine: Negative.   Genitourinary: Negative.  Negative for dysuria and  frequency.  Musculoskeletal: Negative.  Negative for arthralgias, back pain, joint swelling and neck pain.  Skin: Negative.  Negative for rash.  Allergic/Immunologic: Negative.   Neurological: Negative.  Negative for tremors and numbness.  Hematological: Negative for adenopathy. Does not bruise/bleed easily.  Psychiatric/Behavioral: Negative.  Negative for behavioral problems, sleep disturbance and suicidal ideas. The patient is not nervous/anxious.     Vital Signs: BP 115/80   Pulse 69   Temp (!) 97.5 F (36.4 C)   Resp 16   Ht 6\' 1"  (1.854 m)   Wt 215 lb 6.4 oz (97.7 kg)   SpO2 98%   BMI 28.42 kg/m    Physical Exam Vitals and nursing note reviewed.  Constitutional:      General: He is not in acute distress.    Appearance: He is well-developed. He is not diaphoretic.  HENT:     Head: Normocephalic and atraumatic.     Mouth/Throat:     Pharynx: No oropharyngeal exudate.  Eyes:     Pupils: Pupils are equal, round, and reactive to light.  Neck:     Thyroid: No thyromegaly.     Vascular: No JVD.     Trachea: No tracheal deviation.  Cardiovascular:     Rate and Rhythm: Normal rate and regular rhythm.     Heart sounds: Normal heart sounds. No murmur heard.  No friction rub. No gallop.   Pulmonary:     Effort: Pulmonary effort is normal. No respiratory distress.     Breath sounds: Normal breath sounds. No wheezing or rales.  Chest:     Chest wall: No tenderness.  Abdominal:     Palpations: Abdomen is soft.     Tenderness: There is no abdominal tenderness. There is no guarding.  Musculoskeletal:        General: Normal range of motion.     Cervical back: Normal range of motion and neck supple.  Lymphadenopathy:     Cervical: No cervical adenopathy.  Skin:    General: Skin is warm and dry.  Neurological:     Mental Status: He is alert and oriented to person, place, and time.     Cranial Nerves: No cranial nerve deficit.  Psychiatric:        Behavior: Behavior  normal.        Thought Content: Thought content normal.        Judgment: Judgment normal.    Assessment/Plan: 1. Allergy, subsequent encounter Continue flonase and zyrtec.   2. History of prediabetes A1C stable, currently 5.3  - POCT HgB A1C  3. Generalized anxiety disorder Pt is managing well, denies any issues  4. Other fatigue No recent complaints or issues.   General Counseling: tayshaun kroh understanding  of the findings of todays visit and agrees with plan of treatment. I have discussed any further diagnostic evaluation that may be needed or ordered today. We also reviewed his medications today. he has been encouraged to call the office with any questions or concerns that should arise related to todays visit.    No orders of the defined types were placed in this encounter.   No orders of the defined types were placed in this encounter.   Time spent: 30 Minutes   This patient was seen by Blima Ledger AGNP-C in Collaboration with Dr Lyndon Code as a part of collaborative care agreement     Johnna Acosta AGNP-C Internal medicine

## 2019-10-25 ENCOUNTER — Encounter: Payer: Managed Care, Other (non HMO) | Admitting: Adult Health

## 2019-10-26 ENCOUNTER — Telehealth: Payer: Self-pay

## 2019-10-26 NOTE — Telephone Encounter (Signed)
Called pt to RS, no answer

## 2019-11-22 ENCOUNTER — Encounter: Payer: 59 | Admitting: Adult Health

## 2019-11-29 IMAGING — US US ABDOMEN LIMITED
1 series · 14 of 25 positions shown · non-contrast
Comparison: None.

CLINICAL DATA: Nonalcoholic fatty liver disease.

EXAM:
ULTRASOUND ABDOMEN LIMITED RIGHT UPPER QUADRANT

[Series 1: us abdomen limited · 14 of 39 slices shown]
[im 1/39]
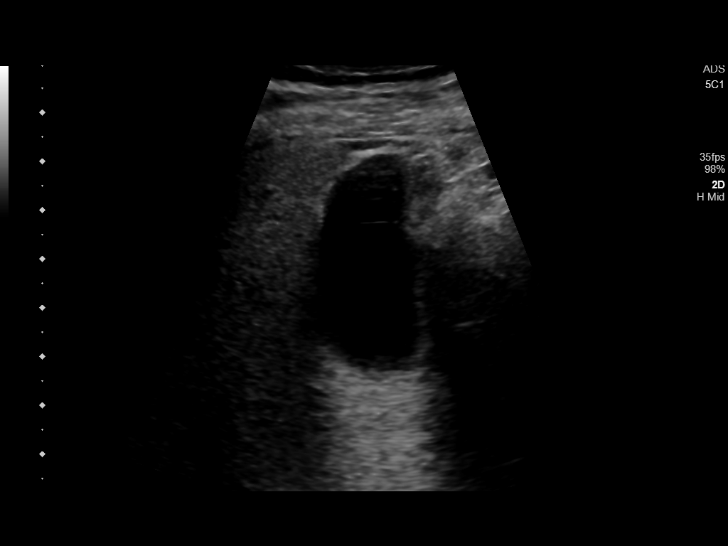
[im 4/39]
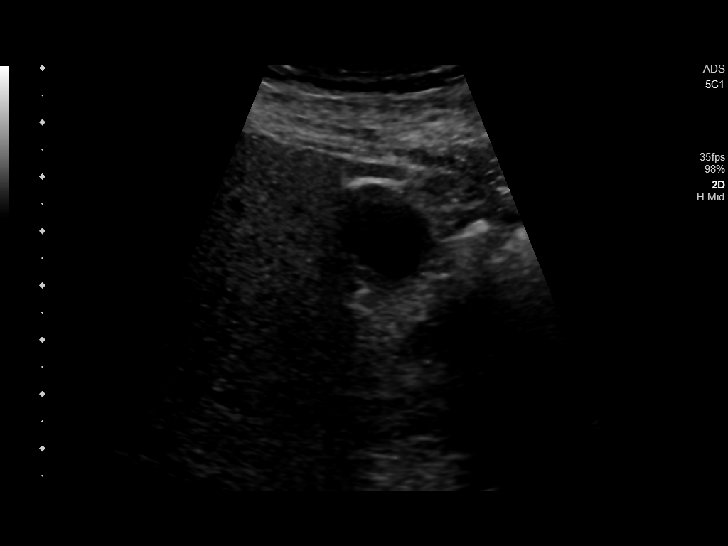
[im 7/39]
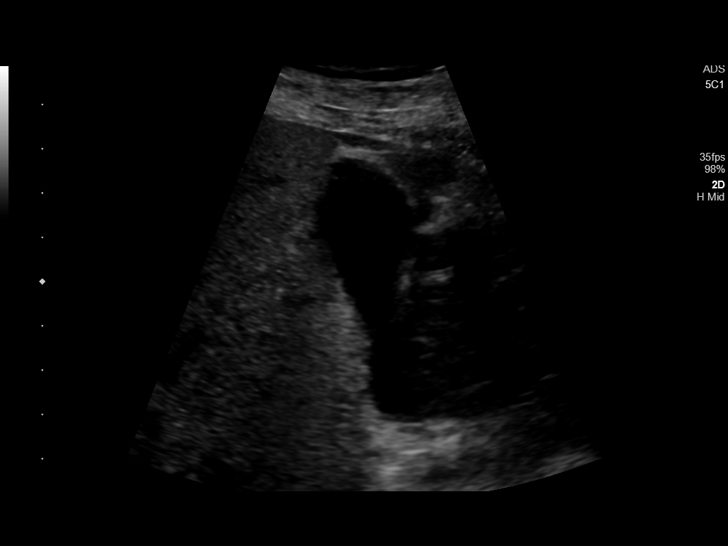
[im 10/39]
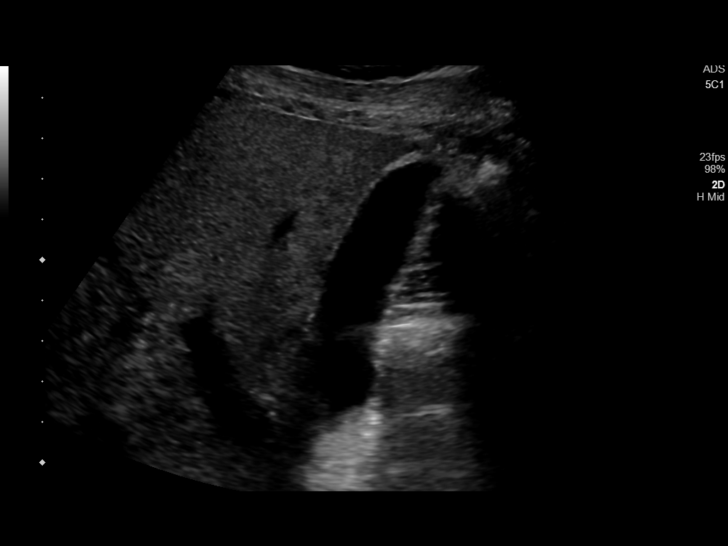
[im 13/39]
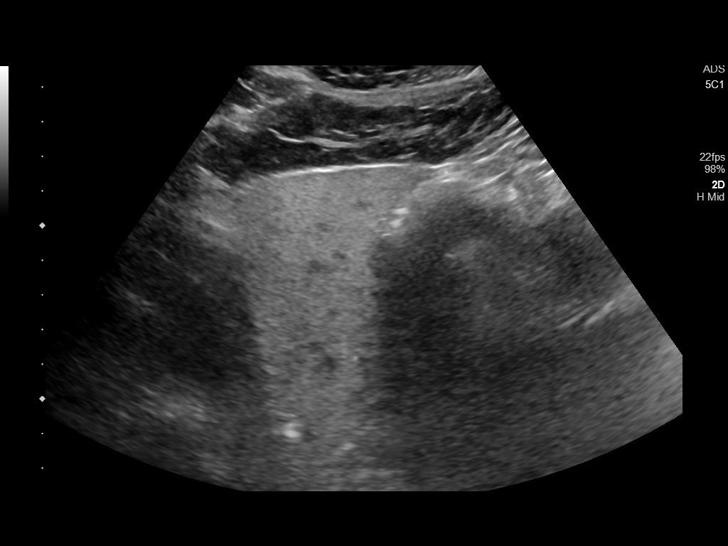
[im 15/39]
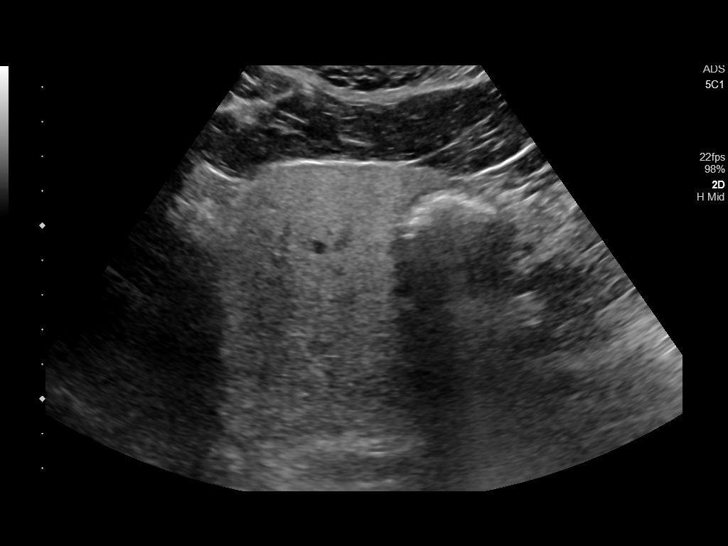
[im 18/39]
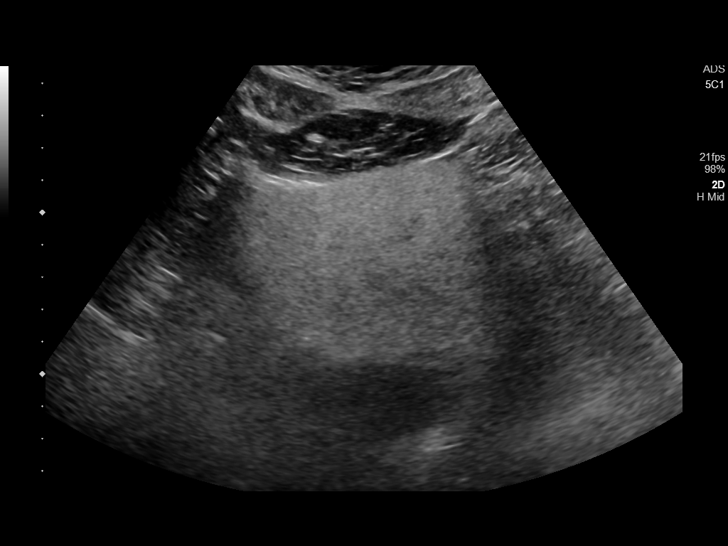
[im 21/39]
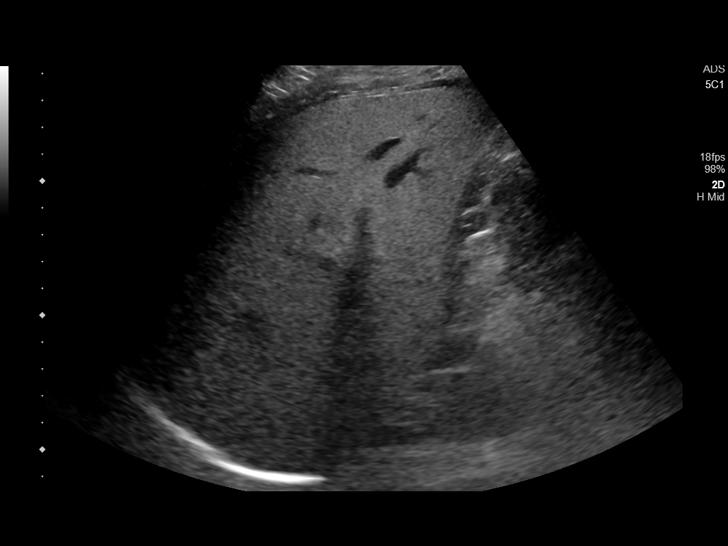
[im 24/39]
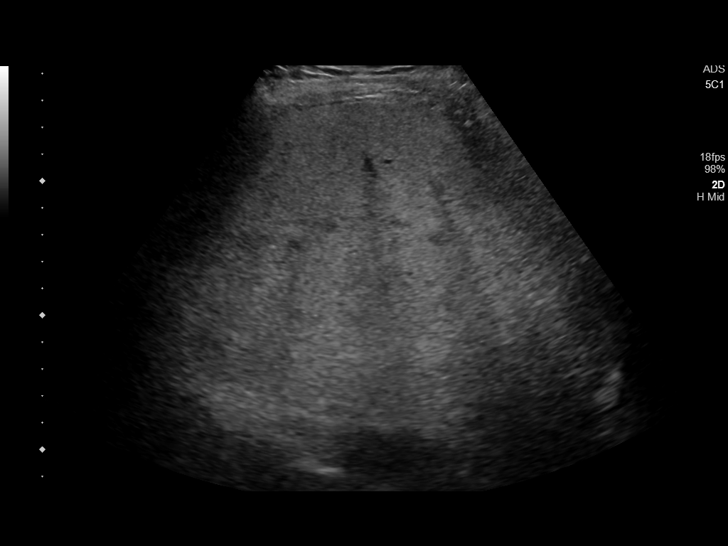
[im 26/39]
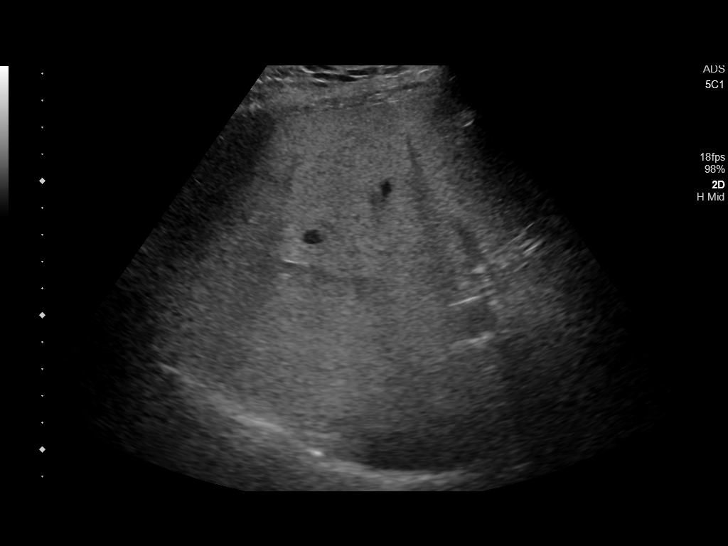
[im 29/39]
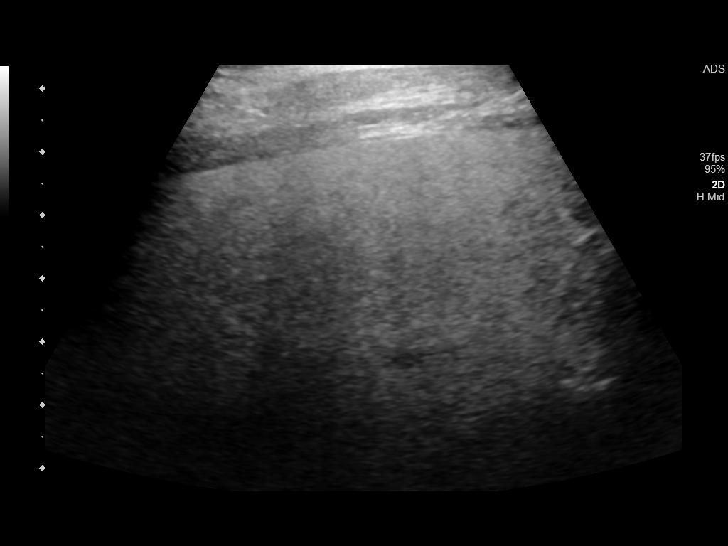
[im 32/39]
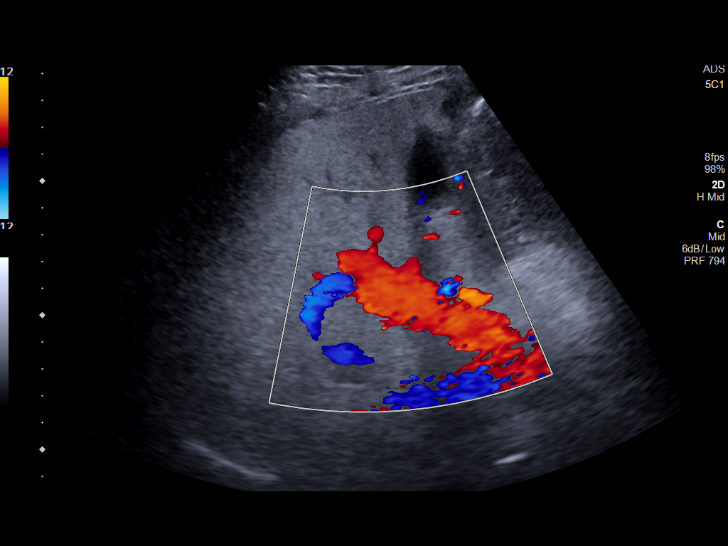
[im 35/39]
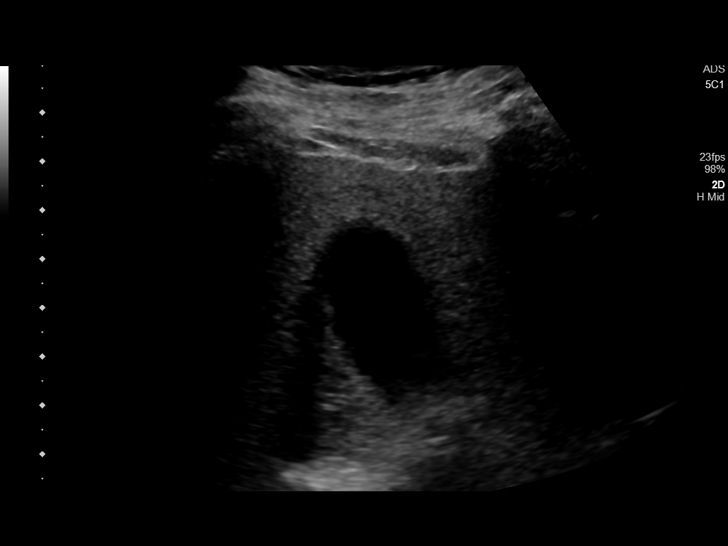
[im 39/39]
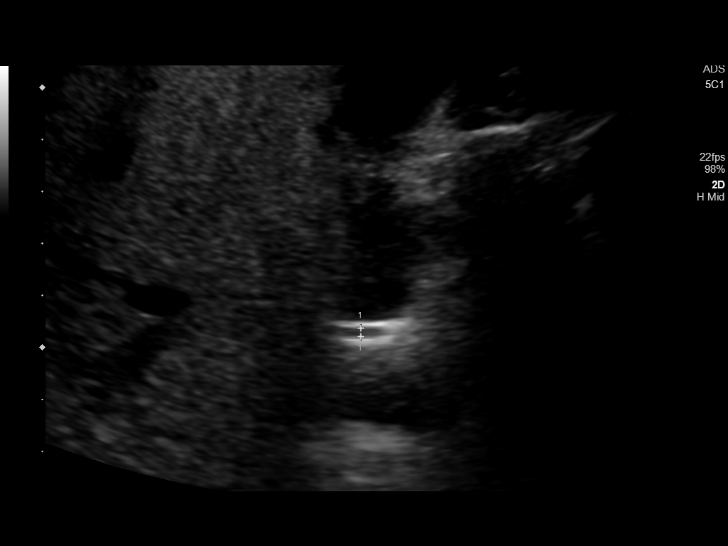

[14 of 25 positions shown; findings below may reference images not displayed]

FINDINGS: Gallbladder:

No gallstones or wall thickening visualized. No sonographic Murphy
sign noted by sonographer.

Common bile duct:

Diameter: 1.6 mm

Liver:

The hepatic echotexture is increased diffusely. There is no focal
mass nor ductal dilation. The surface contour of the liver is
smooth. portal vein is patent on color Doppler imaging with normal
direction of blood flow towards the liver.
IMPRESSION: Increased hepatic echotexture most compatible with fatty
infiltrative change. No discrete hepatic mass or ductal dilation.
Normal appearing gallbladder and common bile duct.

## 2020-09-23 ENCOUNTER — Other Ambulatory Visit: Payer: Self-pay | Admitting: Internal Medicine

## 2021-12-19 ENCOUNTER — Other Ambulatory Visit: Payer: Self-pay

## 2021-12-19 NOTE — Progress Notes (Signed)
Cleared pre-employment UDS,HR notified.

## 2022-02-11 ENCOUNTER — Encounter: Payer: Self-pay | Admitting: Physician Assistant

## 2022-02-11 ENCOUNTER — Ambulatory Visit (INDEPENDENT_AMBULATORY_CARE_PROVIDER_SITE_OTHER): Payer: 59 | Admitting: Physician Assistant

## 2022-02-11 VITALS — BP 127/95 | HR 74 | Temp 98.4°F | Resp 16 | Ht 73.0 in | Wt 208.2 lb

## 2022-02-11 DIAGNOSIS — Z0001 Encounter for general adult medical examination with abnormal findings: Secondary | ICD-10-CM | POA: Diagnosis not present

## 2022-02-11 DIAGNOSIS — R7303 Prediabetes: Secondary | ICD-10-CM | POA: Diagnosis not present

## 2022-02-11 DIAGNOSIS — Z125 Encounter for screening for malignant neoplasm of prostate: Secondary | ICD-10-CM

## 2022-02-11 DIAGNOSIS — E782 Mixed hyperlipidemia: Secondary | ICD-10-CM | POA: Diagnosis not present

## 2022-02-11 DIAGNOSIS — R202 Paresthesia of skin: Secondary | ICD-10-CM | POA: Diagnosis not present

## 2022-02-11 DIAGNOSIS — R300 Dysuria: Secondary | ICD-10-CM

## 2022-02-11 DIAGNOSIS — R200 Anesthesia of skin: Secondary | ICD-10-CM | POA: Diagnosis not present

## 2022-02-11 DIAGNOSIS — R5383 Other fatigue: Secondary | ICD-10-CM | POA: Diagnosis not present

## 2022-02-11 DIAGNOSIS — R030 Elevated blood-pressure reading, without diagnosis of hypertension: Secondary | ICD-10-CM

## 2022-02-11 NOTE — Progress Notes (Signed)
[facility] [address] [place], [place] [address]  Internal MEDICINE  Office Visit Note  Patient Name: Richard Vega  [id number]  [id number]  Date of Service: 02/13/2022  Chief Complaint  Patient presents with   Annual Exam   Foot Problem    Bilateral toe numbness that occurs after sitting with feet elevated for long periods of time.     HPI Pt is here for routine health maintenance examination -He has not been seen in office since 2021 and states this was due to not having insurance and is now insured and required to have CPE for work -Works for [employer] [place], 8-4 usually -bilateral toe numbness recently after he will sit with feet elevated. This previously would not happen -he does report that Dr. Mayford Knife at health bar told him his b12 was low at one point -Elevated BP in office and did not improve on recheck, 140/90. Will have him start monitoring at home. May be due to first time in medical office in a long time. -not exercising -cut back alcohol signifincatly a few years ago to help health. Reports he had some fatty liver with GI previously and stopped this.  -He prefers to not take any medications and will make lifestyle changes where possible to help his health -Due for routine fasting labs  Current Medication: Outpatient Encounter Medications as of 02/11/2022  Medication Sig   albuterol (PROVENTIL) (2.5 MG/3ML) 0.083% nebulizer solution USE ONE VIAL VIA NEBULIZER EVERY 8 HOURS AS NEEDED   cetirizine (ZYRTEC) 10 MG tablet Take 10 mg by mouth as needed for allergies.   fluticasone (FLONASE) 50 MCG/ACT nasal spray SPRAY 2 SPRAYS INTO EACH NOSTRIL EVERY DAY   IBUPROFEN PO Take by mouth as needed.   No facility-administered encounter medications on file as of 02/11/2022.    Surgical History: Past Surgical History:  Procedure Laterality Date   TONSILLECTOMY     TYMPANOSTOMY TUBE PLACEMENT     x2    Medical History: Past Medical History:   Diagnosis Date   Allergy    Liver disease 09/2016   enlarged   Palpitation 12/07/2015   Sleep-disordered breathing 12/07/2015    Family History: Family History  Problem Relation Age of Onset   Lung cancer Father 39      Review of Systems  Constitutional:  Negative for chills, fatigue and unexpected weight change.  HENT:  Negative for congestion, postnasal drip, rhinorrhea, sneezing and sore throat.   Eyes:  Negative for redness.  Respiratory:  Negative for cough, chest tightness and shortness of breath.   Cardiovascular:  Negative for chest pain and palpitations.  Gastrointestinal:  Negative for abdominal pain, constipation, diarrhea, nausea and vomiting.  Genitourinary:  Negative for dysuria and frequency.  Musculoskeletal:  Negative for arthralgias, back pain, joint swelling and neck pain.  Skin:  Negative for rash.  Neurological:  Positive for numbness. Negative for tremors.  Hematological:  Negative for adenopathy. Does not bruise/bleed easily.  Psychiatric/Behavioral:  Negative for behavioral problems (Depression), sleep disturbance and suicidal ideas. The patient is not nervous/anxious.      Vital Signs: BP (!) 127/95   Pulse 74   Temp 98.4 F (36.9 C)   Resp 16   Ht 6\' 1"  (1.854 m)   Wt 208 lb 3.2 oz (94.4 kg)   SpO2 95%   BMI 27.47 kg/m    Physical Exam Constitutional:      General: He is not in acute distress.    Appearance: Normal  appearance. He is well-developed. He is not diaphoretic.  HENT:     Head: Normocephalic and atraumatic.     Mouth/Throat:     Pharynx: No oropharyngeal exudate.  Eyes:     Pupils: Pupils are equal, round, and reactive to light.  Neck:     Thyroid: No thyromegaly.     Vascular: No JVD.     Trachea: No tracheal deviation.  Cardiovascular:     Rate and Rhythm: Normal rate and regular rhythm.     Heart sounds: Normal heart sounds. No murmur heard.    No friction rub. No gallop.  Pulmonary:     Effort: Pulmonary effort  is normal. No respiratory distress.     Breath sounds: No wheezing or rales.  Chest:     Chest wall: No tenderness.  Abdominal:     General: Bowel sounds are normal.     Palpations: Abdomen is soft.     Tenderness: There is no abdominal tenderness.  Musculoskeletal:        General: Normal range of motion.     Cervical back: Normal range of motion and neck supple.  Lymphadenopathy:     Cervical: No cervical adenopathy.  Skin:    General: Skin is warm and dry.  Neurological:     Mental Status: He is alert and oriented to person, place, and time.     Cranial Nerves: No cranial nerve deficit.  Psychiatric:        Behavior: Behavior normal.        Thought Content: Thought content normal.        Judgment: Judgment normal.      LABS: Recent Results (from the past 2160 hour(s))  CBC w/Diff/Platelet     Status: None   Collection Time: 02/12/22  8:15 AM  Result Value Ref Range   WBC 5.0 3.4 - 10.8 x10E3/uL   RBC 5.35 4.14 - 5.80 x10E6/uL   Hemoglobin 16.1 13.0 - 17.7 g/dL   Hematocrit 38.7 56.4 - 51.0 %   MCV 91 79 - 97 fL   MCH 30.1 26.6 - 33.0 pg   MCHC 33.1 31.5 - 35.7 g/dL   RDW 33.2 95.1 - 88.4 %   Platelets 206 150 - 450 x10E3/uL   Neutrophils 57 Not Estab. %   Lymphs 32 Not Estab. %   Monocytes 6 Not Estab. %   Eos 4 Not Estab. %   Basos 1 Not Estab. %   Neutrophils Absolute 2.9 1.4 - 7.0 x10E3/uL   Lymphocytes Absolute 1.6 0.7 - 3.1 x10E3/uL   Monocytes Absolute 0.3 0.1 - 0.9 x10E3/uL   EOS (ABSOLUTE) 0.2 0.0 - 0.4 x10E3/uL   Basophils Absolute 0.1 0.0 - 0.2 x10E3/uL   Immature Granulocytes 0 Not Estab. %   Immature Grans (Abs) 0.0 0.0 - 0.1 x10E3/uL  Comprehensive metabolic panel     Status: Abnormal   Collection Time: 02/12/22  8:15 AM  Result Value Ref Range   Glucose 101 (H) 70 - 99 mg/dL   BUN 12 6 - 24 mg/dL   Creatinine, Ser 1.66 0.76 - 1.27 mg/dL   eGFR 79 >06 TK/ZSW/1.09   BUN/Creatinine Ratio 10 9 - 20   Sodium 139 134 - 144 mmol/L   Potassium 4.4  3.5 - 5.2 mmol/L   Chloride 101 96 - 106 mmol/L   CO2 23 20 - 29 mmol/L   Calcium 9.7 8.7 - 10.2 mg/dL   Total Protein 7.0 6.0 - 8.5 g/dL   Albumin 4.9  4.1 - 5.1 g/dL   Globulin, Total 2.1 1.5 - 4.5 g/dL   Albumin/Globulin Ratio 2.3 (H) 1.2 - 2.2   Bilirubin Total 0.5 0.0 - 1.2 mg/dL   Alkaline Phosphatase 75 44 - 121 IU/L   AST 15 0 - 40 IU/L   ALT 17 0 - 44 IU/L  TSH + free T4     Status: Abnormal   Collection Time: 02/12/22  8:15 AM  Result Value Ref Range   TSH 5.170 (H) 0.450 - 4.500 uIU/mL   Free T4 1.11 0.82 - 1.77 ng/dL  Lipid Panel With LDL/HDL Ratio     Status: Abnormal   Collection Time: 02/12/22  8:15 AM  Result Value Ref Range   Cholesterol, Total 200 (H) 100 - 199 mg/dL   Triglycerides 158 (H) 0 - 149 mg/dL   HDL 49 >39 mg/dL   VLDL Cholesterol Cal 28 5 - 40 mg/dL   LDL Chol Calc (NIH) 123 (H) 0 - 99 mg/dL   LDL/HDL Ratio 2.5 0.0 - 3.6 ratio    Comment:                                     LDL/HDL Ratio                                             Men  Women                               1/2 Avg.Risk  1.0    1.5                                   Avg.Risk  3.6    3.2                                2X Avg.Risk  6.2    5.0                                3X Avg.Risk  8.0    6.1   B12 and Folate Panel     Status: None   Collection Time: 02/12/22  8:15 AM  Result Value Ref Range   Vitamin B-12 749 232 - 1,245 pg/mL   Folate 10.0 >3.0 ng/mL    Comment: A serum folate concentration of less than 3.1 ng/mL is considered to represent clinical deficiency.   PSA Total (Reflex To Free)     Status: None   Collection Time: 02/12/22  8:15 AM  Result Value Ref Range   Prostate Specific Ag, Serum 0.5 0.0 - 4.0 ng/mL    Comment: Roche ECLIA methodology. According to the American Urological Association, Serum PSA should decrease and remain at undetectable levels after radical prostatectomy. The AUA defines biochemical recurrence as an initial PSA value 0.2 ng/mL or greater  followed by a subsequent confirmatory PSA value 0.2 ng/mL or greater. Values obtained with different assay methods or kits cannot be used interchangeably. Results cannot be interpreted as absolute evidence of the presence or absence of malignant disease.  Reflex Criteria Comment     Comment: The percent free PSA is performed on a reflex basis only when the total PSA is between 4.0 and 10.0 ng/mL.   Hgb A1C w/o eAG     Status: Abnormal   Collection Time: 02/12/22  8:15 AM  Result Value Ref Range   Hgb A1c MFr Bld 5.7 (H) 4.8 - 5.6 %    Comment:          Prediabetes: 5.7 - 6.4          Diabetes: >6.4          Glycemic control for adults with diabetes: <7.0         Assessment/Plan: 1. Encounter for general adult medical examination with abnormal findings CPE performed, will update routine fasting labs - CBC w/Diff/Platelet - Comprehensive metabolic panel - TSH + free T4 - Lipid Panel With LDL/HDL Ratio - B12 and Folate Panel - PSA Total (Reflex To Free) - Hgb A1C w/o eAG  2. Elevated BP without diagnosis of hypertension Will start monitoring at home and follow up in 1 month  3. Mixed hyperlipidemia - Lipid Panel With LDL/HDL Ratio  4. Prediabetes - Hgb A1C w/o eAG  5. Screening for prostate cancer - PSA Total (Reflex To Free)  6. Numbness and tingling of both feet - B12 and Folate Panel  7. Other fatigue - CBC w/Diff/Platelet - Comprehensive metabolic panel - TSH + free T4 - Lipid Panel With LDL/HDL Ratio - B12 and Folate Panel - PSA Total (Reflex To Free) - Hgb A1C w/o eAG  8. Dysuria - UA/M w/rflx Culture, Routine   General Counseling: Khalid verbalizes understanding of the findings of todays visit and agrees with plan of treatment. I have discussed any further diagnostic evaluation that may be needed or ordered today. We also reviewed his medications today. he has been encouraged to call the office with any questions or concerns that should arise  related to todays visit.    Counseling:    Orders Placed This Encounter  Procedures   UA/M w/rflx Culture, Routine   CBC w/Diff/Platelet   Comprehensive metabolic panel   TSH + free T4   Lipid Panel With LDL/HDL Ratio   B12 and Folate Panel   PSA Total (Reflex To Free)   Hgb A1C w/o eAG    No orders of the defined types were placed in this encounter.   This patient was seen by Drema Dallas, PA-C in collaboration with Dr. Clayborn Bigness as a part of collaborative care agreement.  Total time spent:35 Minutes  Time spent includes review of chart, medications, test results, and follow up plan with the patient.     Lavera Guise, MD  Internal Medicine

## 2022-02-12 DIAGNOSIS — Z125 Encounter for screening for malignant neoplasm of prostate: Secondary | ICD-10-CM | POA: Diagnosis not present

## 2022-02-12 DIAGNOSIS — R7303 Prediabetes: Secondary | ICD-10-CM | POA: Diagnosis not present

## 2022-02-12 DIAGNOSIS — R200 Anesthesia of skin: Secondary | ICD-10-CM | POA: Diagnosis not present

## 2022-02-12 DIAGNOSIS — E782 Mixed hyperlipidemia: Secondary | ICD-10-CM | POA: Diagnosis not present

## 2022-02-12 DIAGNOSIS — Z0001 Encounter for general adult medical examination with abnormal findings: Secondary | ICD-10-CM | POA: Diagnosis not present

## 2022-02-12 DIAGNOSIS — R202 Paresthesia of skin: Secondary | ICD-10-CM | POA: Diagnosis not present

## 2022-02-12 DIAGNOSIS — R5383 Other fatigue: Secondary | ICD-10-CM | POA: Diagnosis not present

## 2022-02-13 ENCOUNTER — Encounter: Payer: Self-pay | Admitting: Physician Assistant

## 2022-02-13 LAB — B12 AND FOLATE PANEL
Folate: 10 ng/mL (ref >3.0)
Vitamin B-12: 749 pg/mL (ref 232–1245)

## 2022-02-13 LAB — CBC WITH DIFFERENTIAL/PLATELET
Basophils Absolute: 0.1 10*3/uL (ref 0.0–0.2)
Basos: 1 %
EOS (ABSOLUTE): 0.2 10*3/uL (ref 0.0–0.4)
Eos: 4 %
Hematocrit: 48.6 % (ref 37.5–51.0)
Hemoglobin: 16.1 g/dL (ref 13.0–17.7)
Immature Grans (Abs): 0 10*3/uL (ref 0.0–0.1)
Immature Granulocytes: 0 %
Lymphocytes Absolute: 1.6 10*3/uL (ref 0.7–3.1)
Lymphs: 32 %
MCH: 30.1 pg (ref 26.6–33.0)
MCHC: 33.1 g/dL (ref 31.5–35.7)
MCV: 91 fL (ref 79–97)
Monocytes Absolute: 0.3 10*3/uL (ref 0.1–0.9)
Monocytes: 6 %
Neutrophils Absolute: 2.9 10*3/uL (ref 1.4–7.0)
Neutrophils: 57 %
Platelets: 206 10*3/uL (ref 150–450)
RBC: 5.35 x10E6/uL (ref 4.14–5.80)
RDW: 12.2 % (ref 11.6–15.4)
WBC: 5 10*3/uL (ref 3.4–10.8)

## 2022-02-13 LAB — PSA TOTAL (REFLEX TO FREE): Prostate Specific Ag, Serum: 0.5 ng/mL (ref 0.0–4.0)

## 2022-02-13 LAB — COMPREHENSIVE METABOLIC PANEL
ALT: 17 IU/L (ref 0–44)
AST: 15 IU/L (ref 0–40)
Albumin/Globulin Ratio: 2.3 — ABNORMAL HIGH (ref 1.2–2.2)
Albumin: 4.9 g/dL (ref 4.1–5.1)
Alkaline Phosphatase: 75 IU/L (ref 44–121)
BUN/Creatinine Ratio: 10 (ref 9–20)
BUN: 12 mg/dL (ref 6–24)
Bilirubin Total: 0.5 mg/dL (ref 0.0–1.2)
CO2: 23 mmol/L (ref 20–29)
Calcium: 9.7 mg/dL (ref 8.7–10.2)
Chloride: 101 mmol/L (ref 96–106)
Creatinine, Ser: 1.18 mg/dL (ref 0.76–1.27)
Globulin, Total: 2.1 g/dL (ref 1.5–4.5)
Glucose: 101 mg/dL — ABNORMAL HIGH (ref 70–99)
Potassium: 4.4 mmol/L (ref 3.5–5.2)
Sodium: 139 mmol/L (ref 134–144)
Total Protein: 7 g/dL (ref 6.0–8.5)
eGFR: 79 mL/min/{1.73_m2} (ref >59)

## 2022-02-13 LAB — TSH+FREE T4
Free T4: 1.11 ng/dL (ref 0.82–1.77)
TSH: 5.17 u[IU]/mL — ABNORMAL HIGH (ref 0.450–4.500)

## 2022-02-13 LAB — LIPID PANEL WITH LDL/HDL RATIO
Cholesterol, Total: 200 mg/dL — ABNORMAL HIGH (ref 100–199)
HDL: 49 mg/dL (ref >39)
LDL Chol Calc (NIH): 123 mg/dL — ABNORMAL HIGH (ref 0–99)
LDL/HDL Ratio: 2.5 ratio (ref 0.0–3.6)
Triglycerides: 158 mg/dL — ABNORMAL HIGH (ref 0–149)
VLDL Cholesterol Cal: 28 mg/dL (ref 5–40)

## 2022-02-13 LAB — HGB A1C W/O EAG: Hgb A1c MFr Bld: 5.7 % — ABNORMAL HIGH (ref 4.8–5.6)

## 2022-03-11 ENCOUNTER — Encounter: Payer: Self-pay | Admitting: Physician Assistant

## 2022-03-11 ENCOUNTER — Ambulatory Visit (INDEPENDENT_AMBULATORY_CARE_PROVIDER_SITE_OTHER): Payer: 59 | Admitting: Physician Assistant

## 2022-03-11 VITALS — BP 133/84 | HR 90 | Temp 98.3°F | Resp 16 | Ht 73.0 in | Wt 208.0 lb

## 2022-03-11 DIAGNOSIS — E782 Mixed hyperlipidemia: Secondary | ICD-10-CM | POA: Diagnosis not present

## 2022-03-11 DIAGNOSIS — R7303 Prediabetes: Secondary | ICD-10-CM

## 2022-03-11 DIAGNOSIS — R7989 Other specified abnormal findings of blood chemistry: Secondary | ICD-10-CM

## 2022-03-11 NOTE — Progress Notes (Signed)
[facility] [place], [place] [address]  Internal MEDICINE  Office Visit Note  Patient Name: Richard Vega  [id number]  [id number]  Date of Service: 03/21/2022  Chief Complaint  Patient presents with   Follow-up    Review labs    HPI Pt is here for routine follow up for lab review -He saw his labs online already and has been making changes to diet and exercise to help improve numbers without medication -His labs showed elevated TSH, but normal free T4 and will plan to recheck this in a few months and obtain thyroid US for further evaluation.  -His cholesterol was elevated and A1c of 5.7 in prediabetic range -BP at home 114/70s -Cut out a lot of sweets  -BG at home 95-114 fasting  -Walking 1-28mles per day -he is very motivated to make lifestyle changes and will keep this up  Current Medication: Outpatient Encounter Medications as of 03/11/2022  Medication Sig   albuterol (PROVENTIL) (2.5 MG/3ML) 0.083% nebulizer solution USE ONE VIAL VIA NEBULIZER EVERY 8 HOURS AS NEEDED   cetirizine (ZYRTEC) 10 MG tablet Take 10 mg by mouth as needed for allergies.   fluticasone (FLONASE) 50 MCG/ACT nasal spray SPRAY 2 SPRAYS INTO EACH NOSTRIL EVERY DAY   IBUPROFEN PO Take by mouth as needed.   No facility-administered encounter medications on file as of 03/11/2022.    Surgical History: Past Surgical History:  Procedure Laterality Date   TONSILLECTOMY     TYMPANOSTOMY TUBE PLACEMENT     x2    Medical History: Past Medical History:  Diagnosis Date   Allergy    Liver disease 09/2016   enlarged   Palpitation 12/07/2015   Sleep-disordered breathing 12/07/2015    Family History: Family History  Problem Relation Age of Onset   Lung cancer Father 742   Social History   Socioeconomic History   Marital status: Married    Spouse name: Not on file   Number of children: Not on file   Years of education: Not on file   Highest education level: Not on  file  Occupational History   Not on file  Tobacco Use   Smoking status: Former    Years: 3.00    Types: Cigarettes    Quit date: 01/21/2009    Years since quitting: 13.1   Smokeless tobacco: Never  Vaping Use   Vaping Use: Never used  Substance and Sexual Activity   Alcohol use: No    Comment: quit September 2018   Drug use: No   Sexual activity: Yes  Other Topics Concern   Not on file  Social History Narrative   Not on file   Social Determinants of Health   Financial Resource Strain: Not on file  Food Insecurity: Not on file  Transportation Needs: Not on file  Physical Activity: Not on file  Stress: Not on file  Social Connections: Not on file  Intimate Partner Violence: Not on file      Review of Systems  Constitutional:  Negative for chills, fatigue and unexpected weight change.  HENT:  Negative for congestion, postnasal drip, rhinorrhea, sneezing and sore throat.   Eyes:  Negative for redness.  Respiratory:  Negative for cough, chest tightness and shortness of breath.   Cardiovascular:  Negative for chest pain and palpitations.  Gastrointestinal:  Negative for abdominal pain, constipation, diarrhea, nausea and vomiting.  Genitourinary:  Negative for dysuria and frequency.  Musculoskeletal:  Negative for arthralgias, back pain, joint  swelling and neck pain.  Skin:  Negative for rash.  Neurological:  Positive for numbness. Negative for tremors.  Hematological:  Negative for adenopathy. Does not bruise/bleed easily.  Psychiatric/Behavioral:  Negative for behavioral problems (Depression), sleep disturbance and suicidal ideas. The patient is not nervous/anxious.     Vital Signs: BP 133/84   Pulse 90   Temp 98.3 F (36.8 C)   Resp 16   Ht '6\' 1"'$  (1.854 m)   Wt 208 lb (94.3 kg)   SpO2 99%   BMI 27.44 kg/m    Physical Exam Constitutional:      General: He is not in acute distress.    Appearance: Normal appearance. He is well-developed. He is not diaphoretic.   HENT:     Head: Normocephalic and atraumatic.     Mouth/Throat:     Pharynx: No oropharyngeal exudate.  Eyes:     Pupils: Pupils are equal, round, and reactive to light.  Neck:     Thyroid: No thyromegaly.     Vascular: No JVD.     Trachea: No tracheal deviation.  Cardiovascular:     Rate and Rhythm: Normal rate and regular rhythm.     Heart sounds: Normal heart sounds. No murmur heard.    No friction rub. No gallop.  Pulmonary:     Effort: Pulmonary effort is normal. No respiratory distress.     Breath sounds: No wheezing or rales.  Chest:     Chest wall: No tenderness.  Abdominal:     General: Bowel sounds are normal.     Palpations: Abdomen is soft.  Musculoskeletal:        General: Normal range of motion.     Cervical back: Normal range of motion and neck supple.  Lymphadenopathy:     Cervical: No cervical adenopathy.  Skin:    General: Skin is warm and dry.  Neurological:     Mental Status: He is alert and oriented to person, place, and time.     Cranial Nerves: No cranial nerve deficit.  Psychiatric:        Behavior: Behavior normal.        Thought Content: Thought content normal.        Judgment: Judgment normal.        Assessment/Plan: 1. Elevated TSH Will repeat labs for monitoring and check thyroid US for further evaluation - US THYROID; Future - TSH + free T4  2. Mixed hyperlipidemia Continue to work on diet and exercise and may try fish oil supplement  3. Prediabetes Continue to work on diet and exercise   General Counseling: rhodney paulik understanding of the findings of todays visit and agrees with plan of treatment. I have discussed any further diagnostic evaluation that may be needed or ordered today. We also reviewed his medications today. he has been encouraged to call the office with any questions or concerns that should arise related to todays visit.    Orders Placed This Encounter  Procedures   US THYROID   TSH + free T4     No orders of the defined types were placed in this encounter.   This patient was seen by Drema Dallas, PA-C in collaboration with Dr. Clayborn Bigness as a part of collaborative care agreement.   Total time spent:30 Minutes Time spent includes review of chart, medications, test results, and follow up plan with the patient.      Dr Lavera Guise Internal medicine

## 2022-03-21 ENCOUNTER — Ambulatory Visit: Payer: 59 | Attending: Physician Assistant

## 2022-03-28 ENCOUNTER — Ambulatory Visit: Payer: 59 | Admitting: Physician Assistant

## 2022-04-22 ENCOUNTER — Ambulatory Visit: Payer: 59 | Admitting: Physician Assistant

## 2022-06-26 DIAGNOSIS — H52223 Regular astigmatism, bilateral: Secondary | ICD-10-CM | POA: Diagnosis not present

## 2022-06-26 DIAGNOSIS — G43109 Migraine with aura, not intractable, without status migrainosus: Secondary | ICD-10-CM | POA: Diagnosis not present

## 2022-06-26 DIAGNOSIS — H0100A Unspecified blepharitis right eye, upper and lower eyelids: Secondary | ICD-10-CM | POA: Diagnosis not present

## 2022-07-16 ENCOUNTER — Telehealth: Payer: Self-pay | Admitting: Physician Assistant

## 2022-07-16 NOTE — Telephone Encounter (Signed)
Per patient's request. Office note pertaining to anxiety was emailed to him-Toni

## 2022-10-21 ENCOUNTER — Ambulatory Visit: Payer: Self-pay | Admitting: Physician Assistant

## 2022-10-21 ENCOUNTER — Encounter: Payer: Self-pay | Admitting: Physician Assistant

## 2022-10-21 VITALS — BP 134/92 | HR 77 | Temp 98.4°F | Resp 12

## 2022-10-21 DIAGNOSIS — G609 Hereditary and idiopathic neuropathy, unspecified: Secondary | ICD-10-CM

## 2022-10-21 NOTE — Progress Notes (Signed)
No known injury  C/O numbness in toes of toes bilaterally - started in left foot then right foot started.  Experiencing numbness in wrists bilaterally & numbness in fingers on left hand.  States it all started in January.  Went to his PCP initially about the numbness in extremities, but no labs, imaging or medications.  States he was told he was  Pre-diabetic - able to keep under control without medication.  Started walking 2 miles/day - helped with numbness for a little while but it has come back.  AMD

## 2022-10-21 NOTE — Progress Notes (Signed)
   Subjective: Right foot  numbness    Patient ID: Richard Vega, male    DOB: 06-30-1980, 42 y.o.   MRN: [id number]  HPI Patient presents for evaluation of worsening numbness and tingling to the right toes and bilateral wrists and fingers that began January 2024. Reports that symptoms start when sitting in recliner with feet elevated to body level and stops when returning to a standing position. Denies sensation to any other body location, difficulty walking, pain or discomfort.   Review of Systems  Constitutional: Negative.   HENT: Negative.    Respiratory: Negative.    Cardiovascular: Negative.   Gastrointestinal: Negative.   Endocrine: Negative.   Genitourinary: Negative.   Skin: Negative.   Neurological:  Positive for numbness.       Numbness and tingling to right foot and bilateral wrists.        Objective:   Physical Exam Constitutional:      Appearance: He is not ill-appearing.  Cardiovascular:     Rate and Rhythm: Normal rate and regular rhythm.  Pulmonary:     Effort: Pulmonary effort is normal.     Breath sounds: Normal breath sounds.  Musculoskeletal:        General: Normal range of motion.  Skin:    General: Skin is warm and dry.     Capillary Refill: Capillary refill takes less than 2 seconds.  Neurological:     General: No focal deficit present.     Mental Status: He is alert. Mental status is at baseline.     Motor: No weakness.     Coordination: Coordination normal.     Gait: Gait normal.  Psychiatric:        Mood and Affect: Mood normal.        Behavior: Behavior normal.    Today's Vitals   10/21/22 1402  BP: (!) 134/92  Pulse: 77  Resp: 12  Temp: 98.4 F (36.9 C)  TempSrc: Temporal  SpO2: 97%      Assessment & Plan:  Repeat check B-12 level.  Patient referred to neurology for further evaluation.  Advised to contact clinic with any further needs or concerns.

## 2022-10-21 NOTE — Progress Notes (Signed)
   Subjective: Right foot  numbness    Patient ID: Richard Vega, male    DOB: May 16, 1980, 42 y.o.   MRN: [id number]  HPI    Review of Systems     Objective:   Physical Exam        Assessment & Plan:

## 2022-10-21 NOTE — Addendum Note (Signed)
Addended by: Gardner Candle on: 10/21/2022 03:03 PM   Modules accepted: Orders

## 2022-10-22 LAB — HGB A1C W/O EAG: Hgb A1c MFr Bld: 5.8 % — ABNORMAL HIGH (ref 4.8–5.6)

## 2022-10-22 LAB — B12 AND FOLATE PANEL
Folate: 9.3 ng/mL (ref >3.0)
Vitamin B-12: 449 pg/mL (ref 232–1245)

## 2022-12-02 DIAGNOSIS — R200 Anesthesia of skin: Secondary | ICD-10-CM | POA: Diagnosis not present

## 2022-12-02 DIAGNOSIS — G609 Hereditary and idiopathic neuropathy, unspecified: Secondary | ICD-10-CM | POA: Diagnosis not present

## 2022-12-02 DIAGNOSIS — R202 Paresthesia of skin: Secondary | ICD-10-CM | POA: Diagnosis not present

## 2022-12-16 DIAGNOSIS — R202 Paresthesia of skin: Secondary | ICD-10-CM | POA: Diagnosis not present

## 2022-12-16 DIAGNOSIS — R200 Anesthesia of skin: Secondary | ICD-10-CM | POA: Diagnosis not present

## 2022-12-23 DIAGNOSIS — D2262 Melanocytic nevi of left upper limb, including shoulder: Secondary | ICD-10-CM | POA: Diagnosis not present

## 2022-12-23 DIAGNOSIS — D2261 Melanocytic nevi of right upper limb, including shoulder: Secondary | ICD-10-CM | POA: Diagnosis not present

## 2022-12-23 DIAGNOSIS — L814 Other melanin hyperpigmentation: Secondary | ICD-10-CM | POA: Diagnosis not present

## 2022-12-23 DIAGNOSIS — D2272 Melanocytic nevi of left lower limb, including hip: Secondary | ICD-10-CM | POA: Diagnosis not present

## 2022-12-23 DIAGNOSIS — L821 Other seborrheic keratosis: Secondary | ICD-10-CM | POA: Diagnosis not present

## 2022-12-23 DIAGNOSIS — D225 Melanocytic nevi of trunk: Secondary | ICD-10-CM | POA: Diagnosis not present

## 2022-12-23 DIAGNOSIS — D2271 Melanocytic nevi of right lower limb, including hip: Secondary | ICD-10-CM | POA: Diagnosis not present

## 2022-12-23 DIAGNOSIS — L570 Actinic keratosis: Secondary | ICD-10-CM | POA: Diagnosis not present

## 2023-01-01 ENCOUNTER — Ambulatory Visit: Payer: Self-pay | Admitting: Physician Assistant

## 2023-01-01 ENCOUNTER — Encounter: Payer: Self-pay | Admitting: Physician Assistant

## 2023-01-01 DIAGNOSIS — F418 Other specified anxiety disorders: Secondary | ICD-10-CM

## 2023-01-01 NOTE — Progress Notes (Signed)
   Subjective: Test anxiety    Patient ID: Richard Vega, male    DOB: 1980/05/18, 42 y.o.   MRN: [id number]  HPI Patient requesting extra time for recertification testing.  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  Patient refused medication for anxiety.   Review of Systems Generalized anxiety disorder    Objective:   Physical Exam  Physical exam was deferred.      Assessment & Plan: Test anxiety   Patient given form to allow extra time for testing.  Follow-up as necessary.

## 2023-01-01 NOTE — Progress Notes (Signed)
Pt requesting note for anxiety testing and would like to discuss medication.

## 2023-02-07 ENCOUNTER — Ambulatory Visit: Payer: Self-pay

## 2023-02-07 DIAGNOSIS — Z0000 Encounter for general adult medical examination without abnormal findings: Secondary | ICD-10-CM

## 2023-02-07 DIAGNOSIS — G609 Hereditary and idiopathic neuropathy, unspecified: Secondary | ICD-10-CM

## 2023-02-07 LAB — POCT URINALYSIS DIPSTICK
Bilirubin, UA: NEGATIVE
Blood, UA: NEGATIVE
Glucose, UA: NEGATIVE
Ketones, UA: NEGATIVE
Leukocytes, UA: NEGATIVE
Nitrite, UA: NEGATIVE
Protein, UA: NEGATIVE
Spec Grav, UA: 1.015 (ref 1.010–1.025)
Urobilinogen, UA: 0.2 U/dL
pH, UA: 6 (ref 5.0–8.0)

## 2023-02-07 NOTE — Addendum Note (Signed)
Addended by: Christianne Dolin F on: 02/07/2023 12:45 PM   Modules accepted: Orders

## 2023-02-07 NOTE — Progress Notes (Signed)
Pt completed labs for physical. /CL,RMA 

## 2023-02-08 LAB — CMP12+LP+TP+TSH+6AC+PSA+CBC…
ALT: 28 [IU]/L (ref 0–44)
AST: 18 [IU]/L (ref 0–40)
Albumin: 5.2 g/dL — ABNORMAL HIGH (ref 4.1–5.1)
Alkaline Phosphatase: 75 [IU]/L (ref 44–121)
BUN/Creatinine Ratio: 11 (ref 9–20)
BUN: 11 mg/dL (ref 6–24)
Basophils Absolute: 0.1 10*3/uL (ref 0.0–0.2)
Basos: 1 %
Bilirubin Total: 0.6 mg/dL (ref 0.0–1.2)
Calcium: 9.6 mg/dL (ref 8.7–10.2)
Chloride: 99 mmol/L (ref 96–106)
Chol/HDL Ratio: 4.5 {ratio} (ref 0.0–5.0)
Cholesterol, Total: 209 mg/dL — ABNORMAL HIGH (ref 100–199)
Creatinine, Ser: 1 mg/dL (ref 0.76–1.27)
EOS (ABSOLUTE): 0.2 10*3/uL (ref 0.0–0.4)
Eos: 5 %
Estimated CHD Risk: 0.9 times avg. (ref 0.0–1.0)
Free Thyroxine Index: 1.9 (ref 1.2–4.9)
GGT: 18 [IU]/L (ref 0–65)
Globulin, Total: 2.1 g/dL (ref 1.5–4.5)
Glucose: 85 mg/dL (ref 70–99)
HDL: 46 mg/dL (ref >39)
Hematocrit: 50 % (ref 37.5–51.0)
Hemoglobin: 16.9 g/dL (ref 13.0–17.7)
Immature Grans (Abs): 0 10*3/uL (ref 0.0–0.1)
Immature Granulocytes: 0 %
Iron: 99 ug/dL (ref 38–169)
LDH: 171 [IU]/L (ref 121–224)
LDL Chol Calc (NIH): 138 mg/dL — ABNORMAL HIGH (ref 0–99)
Lymphocytes Absolute: 1.5 10*3/uL (ref 0.7–3.1)
Lymphs: 31 %
MCH: 30.5 pg (ref 26.6–33.0)
MCHC: 33.8 g/dL (ref 31.5–35.7)
MCV: 90 fL (ref 79–97)
Monocytes Absolute: 0.3 10*3/uL (ref 0.1–0.9)
Monocytes: 5 %
Neutrophils Absolute: 2.9 10*3/uL (ref 1.4–7.0)
Neutrophils: 58 %
Phosphorus: 3.2 mg/dL (ref 2.8–4.1)
Platelets: 203 10*3/uL (ref 150–450)
Potassium: 4.2 mmol/L (ref 3.5–5.2)
Prostate Specific Ag, Serum: 0.4 ng/mL (ref 0.0–4.0)
RBC: 5.54 x10E6/uL (ref 4.14–5.80)
RDW: 12.4 % (ref 11.6–15.4)
Sodium: 141 mmol/L (ref 134–144)
T3 Uptake Ratio: 23 % — ABNORMAL LOW (ref 24–39)
T4, Total: 8.2 ug/dL (ref 4.5–12.0)
TSH: 3.71 u[IU]/mL (ref 0.450–4.500)
Total Protein: 7.3 g/dL (ref 6.0–8.5)
Triglycerides: 138 mg/dL (ref 0–149)
Uric Acid: 5.6 mg/dL (ref 3.8–8.4)
VLDL Cholesterol Cal: 25 mg/dL (ref 5–40)
WBC: 5.1 10*3/uL (ref 3.4–10.8)
eGFR: 96 mL/min/{1.73_m2} (ref >59)

## 2023-02-09 LAB — VITAMIN B12: Vitamin B-12: 1605 pg/mL — ABNORMAL HIGH (ref 232–1245)

## 2023-02-11 ENCOUNTER — Ambulatory Visit: Payer: Self-pay | Admitting: Physician Assistant

## 2023-02-11 ENCOUNTER — Encounter: Payer: Self-pay | Admitting: Physician Assistant

## 2023-02-11 VITALS — BP 131/87 | HR 61 | Temp 97.6°F | Resp 12 | Ht 73.0 in | Wt 222.0 lb

## 2023-02-11 DIAGNOSIS — Z0000 Encounter for general adult medical examination without abnormal findings: Secondary | ICD-10-CM

## 2023-02-11 NOTE — Addendum Note (Signed)
Addended by: Hansel Feinstein on: 02/11/2023 09:47 AM   Modules accepted: Orders

## 2023-02-11 NOTE — Progress Notes (Signed)
Pt presents today to complete physical, Pt worried about b12 levels being elevated due to supplements. Pt states that if he stops then his neuropathy will flair back up in feet. Richard Vega

## 2023-02-11 NOTE — Progress Notes (Signed)
[facility] Reviewed the ____________________________________________   None    (approximate)  I have reviewed the triage vital signs and the nursing notes.   HISTORY  Chief Complaint Annual Exam   HPI Richard Vega is a 43 y.o. male patient presents for annual physical exam for patient was concerned of elevated B12 levels.  Versus from [facility] neurologist        For the  Past Medical History:  Diagnosis Date   Allergy    Liver disease 09/2016   enlarged   Palpitation 12/07/2015   Sleep-disordered breathing 12/07/2015    Patient Active Problem List   Diagnosis Date Noted   Other fatigue 06/21/2018   Generalized anxiety disorder 06/21/2018   Sleep-disordered breathing 12/07/2015   Palpitation 12/07/2015   Holter monitor, abnormal 12/07/2015    Past Surgical History:  Procedure Laterality Date   TONSILLECTOMY     TYMPANOSTOMY TUBE PLACEMENT     x2    Prior to Admission medications   Medication Sig Start Date End Date Taking? Authorizing Provider  cetirizine (ZYRTEC) 10 MG tablet Take 10 mg by mouth as needed for allergies.    [provider]  fluticasone (FLONASE) 50 MCG/ACT nasal spray SPRAY 2 SPRAYS INTO EACH NOSTRIL EVERY DAY 09/23/20   Lyndon Code, MD  IBUPROFEN PO Take by mouth as needed.    [provider]    Allergies Patient has no known allergies.  Family History  Problem Relation Age of Onset   Lung cancer Father 35    Social History Social History   Tobacco Use   Smoking status: Former    Current packs/day: 0.00    Types: Cigarettes    Start date: 01/21/2006    Quit date: 01/21/2009    Years since quitting: 14.0   Smokeless tobacco: Never  Vaping Use   Vaping status: Never Used  Substance Use Topics   Alcohol use: No    Comment: quit September 2018   Drug use: No    Review of Systems Constitutional: No fever/chills Eyes: No visual changes. ENT: No sore  throat. Cardiovascular: Denies chest pain. Respiratory: Denies shortness of breath. Gastrointestinal: No abdominal pain.  No nausea, no vomiting.  No diarrhea.  No constipation. Genitourinary: Negative for dysuria. Musculoskeletal: Negative for back pain. Skin: Negative for rash. Neurological: Negative for headaches, focal weakness or numbness.  Lower extremity peripheral neuropathy   ____________________________________________   PHYSICAL EXAM:  VITAL SIGNS: BP 131/87  Pulse Rate 61  Temp 97.6 F (36.4 C)  Temp Source Temporal  Weight 222 lb (100.7 kg)  Height 6\' 1"  (1.854 m)  Resp 12   Constitutional: Alert and oriented. Well appearing and in no acute distress. Eyes: Conjunctivae are normal. PERRL. EOMI. Head: Atraumatic. Nose: No congestion/rhinnorhea. Mouth/Throat: Mucous membranes are moist.  Oropharynx non-erythematous. Neck: No stridor.  No cervical spine tenderness to palpation. Hematological/Lymphatic/Immunilogical: No cervical lymphadenopathy. Cardiovascular: Normal rate, regular rhythm. Grossly normal heart sounds.  Good peripheral circulation. Respiratory: Normal respiratory effort.  No retractions. Lungs CTAB. Gastrointestinal: Soft and nontender. No distention. No abdominal bruits. No CVA tenderness. Genitourinary: Deferred Musculoskeletal: No lower extremity tenderness nor edema.  No joint effusions. Neurologic:  Normal speech and language. No gross focal neurologic deficits are appreciated. No gait instability. Skin:  Skin is warm, dry and intact. No rash noted. Psychiatric: Mood and affect are normal. Speech and behavior are normal.  ____________________________________________   LABS _     Component Ref Range &  Units (hover) 4 d ago 4 yr ago  Color, UA yellow Yellow R  Clarity, UA clear   Glucose, UA Negative   Bilirubin, UA neg Negative R  Ketones, UA neg Negative R  Spec Grav, UA 1.015 1.021 R  Blood, UA neg Negative R  pH, UA 6.0 5.0 R   Protein, UA Negative Negative R  Urobilinogen, UA 0.2   Nitrite, UA neg Negative R  Leukocytes, UA Negative Negative  Appearance    Odor    Resulting Agency  [facility]        Specimen Collected: 02/07/23 09:09 Last Resulted: 02/07/23 09:09      Lab Flowsheet      Order Details      View Encounter      Lab and Collection Details      Routing      Result History    View All Conversations on this Encounter    R=Reference range differs from most recent result in table    Result Care Coordination   Patient Communication   Add Comments   Seen Back to Top   Other Results from 02/07/2023   Contains abnormal data Male Executive Panel Order: [id number]  Status: Final result     Next appt: None     Dx: Encounter for annual wellness visit     Test Result Released: Yes (seen)   0 Result Notes             Component Ref Range & Units (hover) 4 d ago (02/07/23) 12 mo ago (02/12/22) 12 mo ago (02/12/22) 12 mo ago (02/12/22) 12 mo ago (02/12/22) 12 mo ago (02/12/22) 4 yr ago (10/23/18) 4 yr ago (10/23/18) 4 yr ago (10/23/18) 4 yr ago (10/23/18)  Glucose 85    101 High   102 High  R     Uric Acid 5.6           Comment:            Therapeutic target for gout patients: <6.0  BUN 11    12  13  R     Creatinine, Ser 1.00    1.18  1.13     eGFR 96    79       BUN/Creatinine Ratio 11    10  12      Sodium 141    139  140     Potassium 4.2    4.4  4.6     Chloride 99    101  106     Calcium 9.6    9.7  9.4     Phosphorus 3.2           Total Protein 7.3    7.0  6.8     Albumin 5.2 High     4.9  5.0 R     Globulin, Total 2.1    2.1  1.8     Bilirubin Total 0.6    0.5  0.4     Alkaline Phosphatase 75    75  66 R     LDH 171           AST 18    15  17      ALT 28    17  17      GGT 18           Iron 99           Cholesterol, Total 209 High   200 High  165   Triglycerides 138  158 High       89   HDL 46  49      33 Low    VLDL Cholesterol Cal 25  28      17    LDL Chol Calc  (NIH) 138 High   123 High       115 High    Chol/HDL Ratio 4.5           Comment:                                   T. Chol/HDL Ratio                                             Men  Women                               1/2 Avg.Risk  3.4    3.3                                   Avg.Risk  5.0    4.4                                2X Avg.Risk  9.6    7.1                                3X Avg.Risk 23.4   11.0  Estimated CHD Risk 0.9           Comment: The CHD Risk is based on the T. Chol/HDL ratio. Other factors affect CHD Risk such as hypertension, smoking, diabetes, severe obesity, and family history of premature CHD.  TSH 3.710   5.170 High     3.870    T4, Total 8.2           T3 Uptake Ratio 23 Low            Free Thyroxine Index 1.9           Prostate Specific Ag, Serum 0.4 0.5 CM          Comment: Roche ECLIA methodology. According to the American Urological Association, Serum PSA should decrease and remain at undetectable levels after radical prostatectomy. The AUA defines biochemical recurrence as an initial PSA value 0.2 ng/mL or greater followed by a subsequent confirmatory PSA value 0.2 ng/mL or greater. Values obtained with different assay methods or kits cannot be used interchangeably. Results cannot be interpreted as absolute evidence of the presence or absence of malignant disease.  WBC 5.1     5.0    4.8  RBC 5.54     5.35    5.06  Hemoglobin 16.9     16.1    15.7  Hematocrit 50.0     48.6    45.9  MCV 90     91    91  MCH 30.5     30.1    31.0  MCHC 33.8     33.1    34.2  RDW 12.4  12.2    13.0  Platelets 203     206    199  Neutrophils 58     57    56  Lymphs 31     32    34  Monocytes 5     6    6   Eos 5     4    2   Basos 1     1    2   Neutrophils Absolute 2.9     2.9    2.7  Lymphocytes Absolute 1.5     1.6    1.6  Monocytes Absolute 0.3     0.3    0.3  EOS (ABSOLUTE) 0.2     0.2    0.1  Basophils Absolute 0.1     0.1    0.1  Immature Granulocytes 0      0    0  Immature Grans (Abs) 0.0     0.0    0.0  Resulting Agency [facility]         Narrative Performed by: Verdell Carmine Performed at:  [address] [facility] [address], [place], [place]  [id number] Lab Director: Jolene Schimke MD, Phone:  [phone]  Specimen Collected: 02/07/23 08:20 Last Resulted: 02/08/23 08:12      Lab Flowsheet       Order Details       View Encounter       Lab and Collection Details       Routing       Result History     View All Conversations on this Encounter    CM=Additional comments  R=Reference range differs from most recent result in table    Result Care Coordination   Patient Communication   Add Comments   Seen Back to Top      Contains abnormal data Vitamin B12 Order: [id number]  Status: Final result      Next appt: None      Dx: Hereditary and idiopathic peripheral ...      Test Result Released: Yes (seen)    0 Result Notes         Component Ref Range & Units (hover) 4 d ago 3 mo ago 12 mo ago  Vitamin B-12 1,605 High  449 749         ______________________________  EKG   ____________________________________________    ____________________________________________   INITIAL IMPRESSION / ASSESSMENT AND PLAN   As part of my medical decision making, I reviewed the following data within the electronic MEDICAL RECORD NUMBER       No acute findings on labs and physical exam.  Patient will come back for EKG.    ____________________________________________   FINAL CLINICAL IMPRESSION Well exam   ED Discharge Orders     None        Note:  This document was prepared using Dragon voice recognition software and may include unintentional dictation errors.

## 2023-02-17 ENCOUNTER — Encounter: Payer: 59 | Admitting: Physician Assistant

## 2023-04-15 DIAGNOSIS — L814 Other melanin hyperpigmentation: Secondary | ICD-10-CM | POA: Diagnosis not present

## 2023-04-15 DIAGNOSIS — L570 Actinic keratosis: Secondary | ICD-10-CM | POA: Diagnosis not present

## 2023-06-18 DIAGNOSIS — J301 Allergic rhinitis due to pollen: Secondary | ICD-10-CM | POA: Diagnosis not present

## 2023-06-18 DIAGNOSIS — J342 Deviated nasal septum: Secondary | ICD-10-CM | POA: Diagnosis not present

## 2023-06-18 DIAGNOSIS — H6060 Unspecified chronic otitis externa, unspecified ear: Secondary | ICD-10-CM | POA: Diagnosis not present

## 2023-12-25 DIAGNOSIS — D2272 Melanocytic nevi of left lower limb, including hip: Secondary | ICD-10-CM | POA: Diagnosis not present

## 2023-12-25 DIAGNOSIS — L821 Other seborrheic keratosis: Secondary | ICD-10-CM | POA: Diagnosis not present

## 2023-12-25 DIAGNOSIS — D2271 Melanocytic nevi of right lower limb, including hip: Secondary | ICD-10-CM | POA: Diagnosis not present

## 2023-12-25 DIAGNOSIS — D2261 Melanocytic nevi of right upper limb, including shoulder: Secondary | ICD-10-CM | POA: Diagnosis not present

## 2023-12-25 DIAGNOSIS — L814 Other melanin hyperpigmentation: Secondary | ICD-10-CM | POA: Diagnosis not present

## 2023-12-25 DIAGNOSIS — D225 Melanocytic nevi of trunk: Secondary | ICD-10-CM | POA: Diagnosis not present

## 2023-12-25 DIAGNOSIS — D2262 Melanocytic nevi of left upper limb, including shoulder: Secondary | ICD-10-CM | POA: Diagnosis not present

## 2024-02-05 ENCOUNTER — Ambulatory Visit: Payer: Self-pay

## 2024-02-05 DIAGNOSIS — Z0000 Encounter for general adult medical examination without abnormal findings: Secondary | ICD-10-CM

## 2024-02-05 LAB — POCT URINALYSIS DIPSTICK
Bilirubin, UA: NEGATIVE
Blood, UA: NEGATIVE
Clarity, UA: NEGATIVE
Glucose, UA: NEGATIVE
Ketones, UA: NEGATIVE
Leukocytes, UA: NEGATIVE
Nitrite, UA: NEGATIVE
Protein, UA: NEGATIVE
Spec Grav, UA: 1.025
Urobilinogen, UA: 0.2 U/dL
pH, UA: 6

## 2024-02-06 LAB — CMP12+LP+TP+TSH+6AC+PSA+CBC…
ALT: 48 IU/L — ABNORMAL HIGH (ref 0–44)
AST: 19 IU/L (ref 0–40)
Albumin: 4.5 g/dL (ref 4.1–5.1)
Alkaline Phosphatase: 69 IU/L (ref 47–123)
BUN/Creatinine Ratio: 13 (ref 9–20)
BUN: 14 mg/dL (ref 6–24)
Basophils Absolute: 0.1 x10E3/uL (ref 0.0–0.2)
Basos: 1 %
Bilirubin Total: 0.6 mg/dL (ref 0.0–1.2)
Calcium: 9.2 mg/dL (ref 8.7–10.2)
Chloride: 102 mmol/L (ref 96–106)
Chol/HDL Ratio: 4.5 ratio (ref 0.0–5.0)
Cholesterol, Total: 188 mg/dL (ref 100–199)
Creatinine, Ser: 1.11 mg/dL (ref 0.76–1.27)
EOS (ABSOLUTE): 0.1 x10E3/uL (ref 0.0–0.4)
Eos: 2 %
Estimated CHD Risk: 0.9 times avg. (ref 0.0–1.0)
Free Thyroxine Index: 1.8 (ref 1.2–4.9)
GGT: 19 IU/L (ref 0–65)
Globulin, Total: 2.3 g/dL (ref 1.5–4.5)
Glucose: 75 mg/dL (ref 70–99)
HDL: 42 mg/dL
Hematocrit: 49.1 % (ref 37.5–51.0)
Hemoglobin: 16.7 g/dL (ref 13.0–17.7)
Immature Grans (Abs): 0.1 x10E3/uL (ref 0.0–0.1)
Immature Granulocytes: 1 %
Iron: 63 ug/dL (ref 38–169)
LDH: 146 IU/L (ref 121–224)
LDL Chol Calc (NIH): 121 mg/dL — ABNORMAL HIGH (ref 0–99)
Lymphocytes Absolute: 2.3 x10E3/uL (ref 0.7–3.1)
Lymphs: 31 %
MCH: 31.2 pg (ref 26.6–33.0)
MCHC: 34 g/dL (ref 31.5–35.7)
MCV: 92 fL (ref 79–97)
Monocytes Absolute: 0.4 x10E3/uL (ref 0.1–0.9)
Monocytes: 6 %
Neutrophils Absolute: 4.4 x10E3/uL (ref 1.4–7.0)
Neutrophils: 59 %
Phosphorus: 3.6 mg/dL (ref 2.8–4.1)
Platelets: 242 x10E3/uL (ref 150–450)
Potassium: 4.1 mmol/L (ref 3.5–5.2)
Prostate Specific Ag, Serum: 0.4 ng/mL (ref 0.0–4.0)
RBC: 5.36 x10E6/uL (ref 4.14–5.80)
RDW: 12.9 % (ref 11.6–15.4)
Sodium: 140 mmol/L (ref 134–144)
T3 Uptake Ratio: 26 % (ref 24–39)
T4, Total: 6.9 ug/dL (ref 4.5–12.0)
TSH: 4.02 u[IU]/mL (ref 0.450–4.500)
Total Protein: 6.8 g/dL (ref 6.0–8.5)
Triglycerides: 137 mg/dL (ref 0–149)
Uric Acid: 5.5 mg/dL (ref 3.8–8.4)
VLDL Cholesterol Cal: 25 mg/dL (ref 5–40)
WBC: 7.4 x10E3/uL (ref 3.4–10.8)
eGFR: 84 mL/min/1.73

## 2024-02-10 ENCOUNTER — Encounter: Payer: Self-pay | Admitting: Physician Assistant

## 2024-02-10 ENCOUNTER — Ambulatory Visit: Payer: Self-pay | Admitting: Physician Assistant

## 2024-02-10 VITALS — BP 125/85 | HR 74 | Resp 16 | Ht 73.0 in | Wt 217.0 lb

## 2024-02-10 DIAGNOSIS — Z0000 Encounter for general adult medical examination without abnormal findings: Secondary | ICD-10-CM

## 2024-02-10 NOTE — Progress Notes (Signed)
Pt presents today to complete physical, pt denies any issues at this time./CL,RMA

## 2024-02-10 NOTE — Progress Notes (Signed)
 "  [facility] ____________________________________________   None    (approximate)  I have reviewed the triage vital signs and the nursing notes.   HISTORY  Chief Complaint Annual Exam   HPI Richard Vega is a 44 y.o. male patient presents for physical exam for the [facility].         Past Medical History:  Diagnosis Date   Allergy    Liver disease 09/2016   enlarged   Palpitation 12/07/2015   Sleep-disordered breathing 12/07/2015    Patient Active Problem List   Diagnosis Date Noted   Other fatigue 06/21/2018   Generalized anxiety disorder 06/21/2018   Sleep-disordered breathing 12/07/2015   Palpitation 12/07/2015   Holter monitor, abnormal 12/07/2015    Past Surgical History:  Procedure Laterality Date   TONSILLECTOMY     TYMPANOSTOMY TUBE PLACEMENT     x2    Prior to Admission medications  Medication Sig Start Date End Date Taking? Authorizing Provider  acetaminophen (TYLENOL) 500 MG tablet Take 500 mg by mouth as needed.   Yes [provider]  IBUPROFEN PO Take by mouth as needed.   Yes [provider]  cetirizine (ZYRTEC) 10 MG tablet Take 10 mg by mouth as needed for allergies.    [provider]  fluticasone  (FLONASE ) 50 MCG/ACT nasal spray SPRAY 2 SPRAYS INTO EACH NOSTRIL EVERY DAY 09/23/20   Khan, Fozia M, MD    Allergies Patient has no known allergies.  Family History  Problem Relation Age of Onset   Lung cancer Father 68    Social History Social History[1]  Review of Systems  Constitutional: No fever/chills Eyes: No visual changes. ENT: No sore throat. Cardiovascular: Denies chest pain. Respiratory: Denies shortness of breath. Gastrointestinal: No abdominal pain.  No nausea, no vomiting.  No diarrhea.  No constipation. Genitourinary: Negative for dysuria. Musculoskeletal: Negative for back pain. Skin: Negative for  rash. Neurological: Negative for headaches, focal weakness or numbness. Psychiatric: Anxiety   ____________________________________________   PHYSICAL EXAM:  VITAL SIGNS:  02/10/2024   8:13 AM  BP 125/85  Cuff Size Large  Pulse Rate 74  Weight 217 lb (98.4 kg)  Height 6' 1 (1.854 m)  Resp 16  SpO2 99 %   BMI: 28.63 kg/m2  BSA: 2.25 m2   Constitutional: Alert and oriented. Well appearing and in no acute distress. Eyes: Conjunctivae are normal. PERRL. EOMI. Head: Atraumatic. Nose: No congestion/rhinnorhea. Mouth/Throat: Mucous membranes are moist.  Oropharynx non-erythematous. Neck: No stridor.No cervical spine tenderness to palpation. Hematological/Lymphatic/Immunilogical: No cervical lymphadenopathy. Cardiovascular: Normal rate, regular rhythm. Grossly normal heart sounds.  Good peripheral circulation. Respiratory: Normal respiratory effort.  No retractions. Lungs CTAB. Gastrointestinal: Soft and nontender. No distention. No abdominal bruits. No CVA tenderness. Genitourinary: Deferred Musculoskeletal: No lower extremity tenderness nor edema.  No joint effusions. Neurologic:  Normal speech and language. No gross focal neurologic deficits are appreciated. No gait instability. Skin:  Skin is warm, dry and intact. No rash noted. Psychiatric: Mood and affect are normal. Speech and behavior are normal.  ____________________________________________   LABS       Component Ref Range & Units (hover) 5 d ago 1 yr ago 5 yr ago  Color, UA yellow yellow Yellow R  Clarity, UA neg clear   Glucose, UA Negative Negative   Bilirubin, UA neg neg Negative R  Ketones, UA neg neg Negative R  Spec Grav, UA 1.025 1.015 1.021 R  Blood,  UA neg neg Negative R  pH, UA 6.0 6.0 5.0 R  Protein, UA Negative Negative Negative R  Urobilinogen, UA 0.2 0.2   Nitrite, UA neg neg Negative R  Leukocytes, UA Negative Negative Negative  Appearance     Odor                     Component Ref  Range & Units (hover) 5 d ago (02/05/24) 1 yr ago (02/07/23) 1 yr ago (02/12/22) 1 yr ago (02/12/22) 1 yr ago (02/12/22) 1 yr ago (02/12/22) 1 yr ago (02/12/22)  Glucose 75 85    101 High    Uric Acid 5.5 5.6 CM       Comment:            Therapeutic target for gout patients: <6.0  BUN 14 11    12    Creatinine, Ser 1.11 1.00    1.18   eGFR 84 96    79   BUN/Creatinine Ratio 13 11    10    Sodium 140 141    139   Potassium 4.1 4.2    4.4   Chloride 102 99    101   Calcium 9.2 9.6    9.7   Phosphorus 3.6 3.2       Total Protein 6.8 7.3    7.0   Albumin 4.5 5.2 High     4.9   Globulin, Total 2.3 2.1    2.1   Bilirubin Total 0.6 0.6    0.5   Alkaline Phosphatase 69 75 R    75 R   LDH 146 171       AST 19 18    15    ALT 48 High  28    17   GGT 19 18       Iron 63 99       Cholesterol, Total 188 209 High   200 High      Triglycerides 137 138  158 High      HDL 42 46  49     VLDL Cholesterol Cal 25 25  28      LDL Chol Calc (NIH) 121 High  138 High   123 High      Chol/HDL Ratio 4.5 4.5 CM       Comment:                                   T. Chol/HDL Ratio                                             Men  Women                               1/2 Avg.Risk  3.4    3.3                                   Avg.Risk  5.0    4.4                                2X Avg.Risk  9.6  7.1                                3X Avg.Risk 23.4   11.0  Estimated CHD Risk 0.9 0.9 CM       Comment: The CHD Risk is based on the T. Chol/HDL ratio. Other factors affect CHD Risk such as hypertension, smoking, diabetes, severe obesity, and family history of premature CHD.  TSH 4.020 3.710   5.170 High     T4, Total 6.9 8.2       T3 Uptake Ratio 26 23 Low        Free Thyroxine Index 1.8 1.9       Prostate Specific Ag, Serum 0.4 0.4 CM 0.5 CM      Comment: Roche ECLIA methodology. According to the American Urological Association, Serum PSA should decrease and remain at undetectable levels after  radical prostatectomy. The AUA defines biochemical recurrence as an initial PSA value 0.2 ng/mL or greater followed by a subsequent confirmatory PSA value 0.2 ng/mL or greater. Values obtained with different assay methods or kits cannot be used interchangeably. Results cannot be interpreted as absolute evidence of the presence or absence of malignant disease.  WBC 7.4 5.1     5.0  RBC 5.36 5.54     5.35  Hemoglobin 16.7 16.9     16.1  Hematocrit 49.1 50.0     48.6  MCV 92 90     91  MCH 31.2 30.5     30.1  MCHC 34.0 33.8     33.1  RDW 12.9 12.4     12.2  Platelets 242 203     206  Neutrophils 59 58     57  Lymphs 31 31     32  Monocytes 6 5     6   Eos 2 5     4   Basos 1 1     1   Neutrophils Absolute 4.4 2.9     2.9  Lymphocytes Absolute 2.3 1.5     1.6  Monocytes Absolute 0.4 0.3     0.3  EOS (ABSOLUTE) 0.1 0.2     0.2  Basophils Absolute 0.1 0.1     0.1  Immature Granulocytes 1 0     0  Immature Grans (Abs) 0.1 0.0     0.0                ____________________________________________  EKG  Bradycardic at 59 bpm ____________________________________________    ____________________________________________   INITIAL IMPRESSION / ASSESSMENT AND PLAN   As part of my medical decision making, I reviewed the following data within the electronic MEDICAL RECORD NUMBER      No acute findings on physical exam, EKG, labs.        ____________________________________________   FINAL CLINICAL IMPRESSION Annual physical exam   ED Discharge Orders     None        Note:  This document was prepared using Dragon voice recognition software and may include unintentional dictation errors.     [1]  Social History Tobacco Use   Smoking status: Former    Current packs/day: 0.00    Types: Cigarettes    Start date: 01/21/2006    Quit date: 01/21/2009    Years since quitting: 15.0   Smokeless tobacco: Never  Vaping Use   Vaping status: Never Used  Substance Use Topics    Alcohol use: No  Comment: quit September 2018   Drug use: No   "

## 2024-02-12 LAB — TESTOSTERONE: Testosterone: 375 ng/dL (ref 264–916)

## 2024-02-12 LAB — SPECIMEN STATUS REPORT
